# Patient Record
Sex: Female | Born: 1985
Health system: Southern US, Community
[De-identification: ages and names within clinical notes are randomized; demographics above are authoritative.]

## PROBLEM LIST (undated history)

## (undated) DIAGNOSIS — E119 Type 2 diabetes mellitus without complications: Secondary | ICD-10-CM

## (undated) DIAGNOSIS — F419 Anxiety disorder, unspecified: Secondary | ICD-10-CM

## (undated) DIAGNOSIS — O24419 Gestational diabetes mellitus in pregnancy, unspecified control: Secondary | ICD-10-CM

## (undated) HISTORY — PX: WISDOM TOOTH EXTRACTION: SHX21

## (undated) HISTORY — DX: Gestational diabetes mellitus in pregnancy, unspecified control: O24.419

---

## 2011-12-13 DIAGNOSIS — Z87440 Personal history of urinary (tract) infections: Secondary | ICD-10-CM | POA: Insufficient documentation

## 2011-12-13 DIAGNOSIS — Z87718 Personal history of other specified (corrected) congenital malformations of genitourinary system: Secondary | ICD-10-CM | POA: Insufficient documentation

## 2011-12-13 DIAGNOSIS — Z87448 Personal history of other diseases of urinary system: Secondary | ICD-10-CM | POA: Insufficient documentation

## 2011-12-13 DIAGNOSIS — Z9189 Other specified personal risk factors, not elsewhere classified: Secondary | ICD-10-CM | POA: Insufficient documentation

## 2013-05-20 DIAGNOSIS — E538 Deficiency of other specified B group vitamins: Secondary | ICD-10-CM | POA: Insufficient documentation

## 2013-09-17 DIAGNOSIS — H5210 Myopia, unspecified eye: Secondary | ICD-10-CM | POA: Insufficient documentation

## 2013-12-12 DIAGNOSIS — O4590 Premature separation of placenta, unspecified, unspecified trimester: Secondary | ICD-10-CM

## 2015-02-17 ENCOUNTER — Encounter: Payer: Self-pay | Admitting: Emergency Medicine

## 2015-02-17 ENCOUNTER — Emergency Department
Admission: EM | Admit: 2015-02-17 | Discharge: 2015-02-17 | Disposition: A | Discharge status: Home / Self Care | Payer: 59 | Source: Home / Self Care | Attending: Family Medicine | Admitting: Family Medicine

## 2015-02-17 DIAGNOSIS — N3090 Cystitis, unspecified without hematuria: Secondary | ICD-10-CM

## 2015-02-17 DIAGNOSIS — R300 Dysuria: Secondary | ICD-10-CM

## 2015-02-17 LAB — POCT URINALYSIS DIP (MANUAL ENTRY)
Bilirubin, UA: NEGATIVE
Glucose, UA: NEGATIVE
Ketones, POC UA: NEGATIVE
NITRITE UA: NEGATIVE
Protein Ur, POC: NEGATIVE
Spec Grav, UA: 1.01 (ref 1.005–1.03)
UROBILINOGEN UA: 0.2 (ref 0–1)
pH, UA: 6.5 (ref 5–8)

## 2015-02-17 MED ORDER — CEPHALEXIN 500 MG PO CAPS: ORAL_CAPSULE | ORAL | Status: DC

## 2015-02-17 NOTE — ED Notes (Signed)
Polyuria started this morning

## 2015-02-17 NOTE — ED Provider Notes (Signed)
CSN: [id number]     Arrival date & time 02/17/15  1343 History   First MD Initiated Contact with Patient 02/17/15 1411     Chief Complaint  Patient presents with  . Polyuria      HPI Comments: Patient awoke this morning with dysuria, frequency, urgency, and hesitancy.  No fevers, chills, and sweats.  No abdominal or pelvic pain.  Patient's last menstrual period was 02/04/2015.   Patient is a 29 y.o. female presenting with dysuria. The history is provided by the patient.  Dysuria Pain quality:  Burning Pain severity:  Moderate Onset quality:  Sudden Duration:  7 hours Timing:  Constant Progression:  Worsening Chronicity:  New Recent urinary tract infections: no   Relieved by:  Nothing Worsened by:  Nothing tried Ineffective treatments:  Phenazopyridine Urinary symptoms: foul-smelling urine, frequent urination and hesitancy   Urinary symptoms: no discolored urine, no hematuria and no bladder incontinence   Associated symptoms: no abdominal pain, no fever, no flank pain, no genital lesions, no nausea, no vaginal discharge and no vomiting   Risk factors: recurrent urinary tract infections     History reviewed. No pertinent past medical history. Past Surgical History  Procedure Laterality Date  . Cesarean section     Family History  Problem Relation Age of Onset  . Diabetes Mother   . Hypertension Father    History  Substance Use Topics  . Smoking status: Never Smoker   . Smokeless tobacco: Not on file  . Alcohol Use: Yes   OB History    No data available     Review of Systems  Constitutional: Negative for fever.  Gastrointestinal: Negative for nausea, vomiting and abdominal pain.  Genitourinary: Positive for dysuria. Negative for flank pain and vaginal discharge.  All other systems reviewed and are negative.   Allergies  Review of patient's allergies indicates no known allergies.  Home Medications   Prior to Admission medications   Medication Sig Start Date  End Date Taking? Authorizing Provider  cephALEXin (KEFLEX) 500 MG capsule Take one capsule by mouth every 12 hours for one week 02/17/15   Lattie HawStephen A Bilal Manzer, MD   BP 104/71 mmHg  Pulse 87  Temp(Src) 97.7 F (36.5 C) (Oral)  Ht 4\' 11"  (1.499 m)  Wt 112 lb (50.803 kg)  BMI 22.61 kg/m2  SpO2 100%  LMP 02/04/2015 Physical Exam Nursing notes and Vital Signs reviewed. Appearance:  Patient appears stated age, and in no acute distress Eyes:  Pupils are equal, round, and reactive to light and accomodation.  Extraocular movement is intact.  Conjunctivae are not inflamed   Pharynx:  Normal; moist mucous membranes  Neck:  Supple.  No adenopathy Lungs:  Clear to auscultation.  Breath sounds are equal.  Heart:  Regular rate and rhythm without murmurs, rubs, or gallops.  Abdomen:  Nontender without masses or hepatosplenomegaly.  Bowel sounds are present.  No CVA or flank tenderness.  Extremities:  No edema.  No calf tenderness Skin:  No rash present.    ED Course  Procedures  None    Labs Reviewed  URINE CULTURE  POCT URINALYSIS DIP (MANUAL ENTRY):  BLO small; LEU moderate; otherwise negative         MDM   1. Dysuria   2. Cystitis    Urine culture pending.  Begin Keflex 500mg  BID Continue increased fluid intake. Followup with Family Doctor if not improved in one week.     Lattie HawStephen A Essica Kiker, MD 02/17/15 [phone]

## 2015-02-17 NOTE — Discharge Instructions (Signed)
Continue increased fluid intake. ° ° °Urinary Tract Infection °Urinary tract infections (UTIs) can develop anywhere along your urinary tract. Your urinary tract is your body's drainage system for removing wastes and extra water. Your urinary tract includes two kidneys, two ureters, a bladder, and a urethra. Your kidneys are a pair of bean-shaped organs. Each kidney is about the size of your fist. They are located below your ribs, one on each side of your spine. °CAUSES °Infections are caused by microbes, which are microscopic organisms, including fungi, viruses, and bacteria. These organisms are so small that they can only be seen through a microscope. Bacteria are the microbes that most commonly cause UTIs. °SYMPTOMS  °Symptoms of UTIs may vary by age and gender of the patient and by the location of the infection. Symptoms in young women typically include a frequent and intense urge to urinate and a painful, burning feeling in the bladder or urethra during urination. Older women and men are more likely to be tired, shaky, and weak and have muscle aches and abdominal pain. A fever may mean the infection is in your kidneys. Other symptoms of a kidney infection include pain in your back or sides below the ribs, nausea, and vomiting. °DIAGNOSIS °To diagnose a UTI, your caregiver will ask you about your symptoms. Your caregiver also will ask to provide a urine sample. The urine sample will be tested for bacteria and white blood cells. White blood cells are made by your body to help fight infection. °TREATMENT  °Typically, UTIs can be treated with medication. Because most UTIs are caused by a bacterial infection, they usually can be treated with the use of antibiotics. The choice of antibiotic and length of treatment depend on your symptoms and the type of bacteria causing your infection. °HOME CARE INSTRUCTIONS °· If you were prescribed antibiotics, take them exactly as your caregiver instructs you. Finish the  medication even if you feel better after you have only taken some of the medication. °· Drink enough water and fluids to keep your urine clear or pale yellow. °· Avoid caffeine, tea, and carbonated beverages. They tend to irritate your bladder. °· Empty your bladder often. Avoid holding urine for long periods of time. °· Empty your bladder before and after sexual intercourse. °· After a bowel movement, women should cleanse from front to back. Use each tissue only once. °SEEK MEDICAL CARE IF:  °· You have back pain. °· You develop a fever. °· Your symptoms do not begin to resolve within 3 days. °SEEK IMMEDIATE MEDICAL CARE IF:  °· You have severe back pain or lower abdominal pain. °· You develop chills. °· You have nausea or vomiting. °· You have continued burning or discomfort with urination. °MAKE SURE YOU:  °· Understand these instructions. °· Will watch your condition. °· Will get help right away if you are not doing well or get worse. °Document Released: 09/20/2005 Document Revised: 06/11/2012 Document Reviewed: 01/19/2012 °ExitCare® Patient Information ©2015 [employer]. This information is not intended to replace advice given to you by your health care provider. Make sure you discuss any questions you have with your health care provider. ° °

## 2015-02-19 LAB — URINE CULTURE

## 2015-02-21 ENCOUNTER — Telehealth: Payer: Self-pay | Admitting: *Deleted

## 2015-03-21 LAB — OB RESULTS CONSOLE GC/CHLAMYDIA: GC PROBE AMP, GENITAL: NEGATIVE

## 2015-05-18 ENCOUNTER — Other Ambulatory Visit (HOSPITAL_BASED_OUTPATIENT_CLINIC_OR_DEPARTMENT_OTHER): Payer: Self-pay | Admitting: Internal Medicine

## 2015-05-18 DIAGNOSIS — M25531 Pain in right wrist: Secondary | ICD-10-CM

## 2015-05-19 ENCOUNTER — Ambulatory Visit (HOSPITAL_BASED_OUTPATIENT_CLINIC_OR_DEPARTMENT_OTHER)
Admission: RE | Admit: 2015-05-19 | Discharge: 2015-05-19 | Disposition: A | Discharge status: Home / Self Care | Payer: 59 | Source: Ambulatory Visit | Attending: Internal Medicine | Admitting: Internal Medicine

## 2015-05-19 DIAGNOSIS — M25531 Pain in right wrist: Secondary | ICD-10-CM | POA: Diagnosis not present

## 2015-05-19 DIAGNOSIS — R2231 Localized swelling, mass and lump, right upper limb: Secondary | ICD-10-CM | POA: Insufficient documentation

## 2015-07-17 ENCOUNTER — Emergency Department
Admission: EM | Admit: 2015-07-17 | Discharge: 2015-07-17 | Disposition: A | Discharge status: Home / Self Care | Payer: 59 | Source: Home / Self Care | Attending: Family Medicine | Admitting: Family Medicine

## 2015-07-17 ENCOUNTER — Telehealth: Payer: Self-pay | Admitting: Emergency Medicine

## 2015-07-17 ENCOUNTER — Encounter: Payer: Self-pay | Admitting: *Deleted

## 2015-07-17 DIAGNOSIS — N3090 Cystitis, unspecified without hematuria: Secondary | ICD-10-CM

## 2015-07-17 LAB — POCT URINALYSIS DIP (MANUAL ENTRY)
BILIRUBIN UA: NEGATIVE
BILIRUBIN UA: NEGATIVE
Glucose, UA: NEGATIVE
Nitrite, UA: NEGATIVE
Protein Ur, POC: NEGATIVE
Urobilinogen, UA: 0.2 (ref 0–1)
pH, UA: 6.5 (ref 5–8)

## 2015-07-17 MED ORDER — CEPHALEXIN 500 MG PO CAPS: ORAL_CAPSULE | ORAL | Status: DC

## 2015-07-17 NOTE — ED Provider Notes (Signed)
CSN: [id number]     Arrival date & time 07/17/15  4098 History   First MD Initiated Contact with Patient 07/17/15 1106     Chief Complaint  Patient presents with  . Polyuria      HPI Comments: Patient awoke today with urgency, urinary frequency, and lower abdominal pressure.  No pelvic or abdominal pain.  She may have had chills.  She is finishing her current menstrual period.  She states that she is currently breast feeding.  Patient is a 29 y.o. female presenting with frequency. The history is provided by the patient.  Urinary Frequency This is a new problem. The current episode started 3 to 5 hours ago. The problem occurs constantly. The problem has been gradually worsening. Pertinent negatives include no abdominal pain. Nothing aggravates the symptoms. Nothing relieves the symptoms. She has tried nothing for the symptoms.    History reviewed. No pertinent past medical history. Past Surgical History  Procedure Laterality Date  . Cesarean section     Family History  Problem Relation Age of Onset  . Diabetes Mother   . Hypertension Father    History  Substance Use Topics  . Smoking status: Never Smoker   . Smokeless tobacco: Never Used  . Alcohol Use: Yes   OB History    No data available     Review of Systems  Constitutional: Negative for fever and fatigue.  Gastrointestinal: Negative for abdominal pain.  Genitourinary: Positive for urgency and frequency. Negative for hematuria, flank pain, vaginal discharge, difficulty urinating, vaginal pain and pelvic pain.  All other systems reviewed and are negative.   Allergies  Review of patient's allergies indicates no known allergies.  Home Medications   Prior to Admission medications   Medication Sig Start Date End Date Taking? Authorizing Provider  Multiple Vitamin (MULTIVITAMIN) tablet Take 1 tablet by mouth daily.   Yes Historical Provider, MD  cephALEXin (KEFLEX) 500 MG capsule Take one capsule by mouth every 12 hours  for one week 07/17/15   Lattie Haw, MD   BP 114/78 mmHg  Pulse 87  Temp(Src) 98.2 F (36.8 C) (Oral)  Ht 4\' 11"  (1.499 m)  Wt 113 lb 6.4 oz (51.438 kg)  BMI 22.89 kg/m2  SpO2 99%  LMP 07/10/2015 Physical Exam Nursing notes and Vital Signs reviewed. Appearance:  Patient appears stated age, and in no acute distress Eyes:  Pupils are equal, round, and reactive to light and accomodation.  Extraocular movement is intact.  Conjunctivae are not inflamed  Pharynx:  Normal; moist mucous membranes  Neck:  Supple.  No adenopathy Lungs:  Clear to auscultation.  Breath sounds are equal.  Moving air well. Heart:  Regular rate and rhythm without murmurs, rubs, or gallops.  Abdomen:  Nontender without masses or hepatosplenomegaly.  Bowel sounds are present.  No CVA or flank tenderness.  Extremities:  No edema.  No calf tenderness Skin:  No rash present.   ED Course  Procedures none   Labs Reviewed  POCT URINALYSIS DIP (MANUAL ENTRY) - Abnormal; Notable for the following:    Color, UA light yellow (*)    Blood, UA small (*)    Leukocytes, UA moderate (2+) (*)    All other components within normal limits  URINE CULTURE      MDM   1. Cystitis    Urine culture pending. Begin Keflex 500mg  BID for one week. Continue increased fluid intake. If symptoms become significantly worse during the night or over the weekend, proceed  to the local emergency room.  Followup with Family Doctor if not improved in one week.     Lattie Haw, MD 07/18/15 2119

## 2015-07-17 NOTE — Discharge Instructions (Signed)
Continue increased fluid intake. °If symptoms become significantly worse during the night or over the weekend, proceed to the local emergency room.  ° ° °Urinary Tract Infection °Urinary tract infections (UTIs) can develop anywhere along your urinary tract. Your urinary tract is your body's drainage system for removing wastes and extra water. Your urinary tract includes two kidneys, two ureters, a bladder, and a urethra. Your kidneys are a pair of bean-shaped organs. Each kidney is about the size of your fist. They are located below your ribs, one on each side of your spine. °CAUSES °Infections are caused by microbes, which are microscopic organisms, including fungi, viruses, and bacteria. These organisms are so small that they can only be seen through a microscope. Bacteria are the microbes that most commonly cause UTIs. °SYMPTOMS  °Symptoms of UTIs may vary by age and gender of the patient and by the location of the infection. Symptoms in young women typically include a frequent and intense urge to urinate and a painful, burning feeling in the bladder or urethra during urination. Older women and men are more likely to be tired, shaky, and weak and have muscle aches and abdominal pain. A fever may mean the infection is in your kidneys. Other symptoms of a kidney infection include pain in your back or sides below the ribs, nausea, and vomiting. °DIAGNOSIS °To diagnose a UTI, your caregiver will ask you about your symptoms. Your caregiver also will ask to provide a urine sample. The urine sample will be tested for bacteria and white blood cells. White blood cells are made by your body to help fight infection. °TREATMENT  °Typically, UTIs can be treated with medication. Because most UTIs are caused by a bacterial infection, they usually can be treated with the use of antibiotics. The choice of antibiotic and length of treatment depend on your symptoms and the type of bacteria causing your infection. °HOME CARE  INSTRUCTIONS °· If you were prescribed antibiotics, take them exactly as your caregiver instructs you. Finish the medication even if you feel better after you have only taken some of the medication. °· Drink enough water and fluids to keep your urine clear or pale yellow. °· Avoid caffeine, tea, and carbonated beverages. They tend to irritate your bladder. °· Empty your bladder often. Avoid holding urine for long periods of time. °· Empty your bladder before and after sexual intercourse. °· After a bowel movement, women should cleanse from front to back. Use each tissue only once. °SEEK MEDICAL CARE IF:  °· You have back pain. °· You develop a fever. °· Your symptoms do not begin to resolve within 3 days. °SEEK IMMEDIATE MEDICAL CARE IF:  °· You have severe back pain or lower abdominal pain. °· You develop chills. °· You have nausea or vomiting. °· You have continued burning or discomfort with urination. °MAKE SURE YOU:  °· Understand these instructions. °· Will watch your condition. °· Will get help right away if you are not doing well or get worse. °Document Released: 09/20/2005 Document Revised: 06/11/2012 Document Reviewed: 01/19/2012 °ExitCare® Patient Information ©2015 [employer]. This information is not intended to replace advice given to you by your health care provider. Make sure you discuss any questions you have with your health care provider. ° °

## 2015-07-17 NOTE — ED Notes (Signed)
Pt c/o increased of frequency, urgency, no dysuria since this am.  No blood.  Pt has IUD.

## 2015-07-18 LAB — URINE CULTURE

## 2015-07-22 ENCOUNTER — Encounter: Payer: Self-pay | Admitting: Obstetrics & Gynecology

## 2015-07-22 ENCOUNTER — Ambulatory Visit (INDEPENDENT_AMBULATORY_CARE_PROVIDER_SITE_OTHER): Payer: 59 | Admitting: Obstetrics & Gynecology

## 2015-07-22 VITALS — BP 99/64 | HR 87 | Resp 16 | Ht 59.0 in | Wt 115.0 lb

## 2015-07-22 DIAGNOSIS — Z1151 Encounter for screening for human papillomavirus (HPV): Secondary | ICD-10-CM | POA: Diagnosis not present

## 2015-07-22 DIAGNOSIS — Z9889 Other specified postprocedural states: Secondary | ICD-10-CM

## 2015-07-22 DIAGNOSIS — Z98891 History of uterine scar from previous surgery: Secondary | ICD-10-CM

## 2015-07-22 DIAGNOSIS — Z01419 Encounter for gynecological examination (general) (routine) without abnormal findings: Secondary | ICD-10-CM

## 2015-07-22 DIAGNOSIS — Z30432 Encounter for removal of intrauterine contraceptive device: Secondary | ICD-10-CM | POA: Diagnosis not present

## 2015-07-22 DIAGNOSIS — Z124 Encounter for screening for malignant neoplasm of cervix: Secondary | ICD-10-CM

## 2015-07-22 DIAGNOSIS — R7989 Other specified abnormal findings of blood chemistry: Secondary | ICD-10-CM | POA: Insufficient documentation

## 2015-07-22 LAB — CBC
HCT: 40.3 % (ref 36.0–46.0)
Hemoglobin: 13.1 g/dL (ref 12.0–15.0)
MCH: 24.9 pg — ABNORMAL LOW (ref 26.0–34.0)
MCHC: 32.5 g/dL (ref 30.0–36.0)
MCV: 76.6 fL — ABNORMAL LOW (ref 78.0–100.0)
MPV: 9.6 fL (ref 8.6–12.4)
Platelets: 317 10*3/uL (ref 150–400)
RBC: 5.26 MIL/uL — ABNORMAL HIGH (ref 3.87–5.11)
RDW: 14.3 % (ref 11.5–15.5)
WBC: 10.6 10*3/uL — AB (ref 4.0–10.5)

## 2015-07-22 LAB — VITAMIN B12: Vitamin B-12: 549 pg/mL (ref 211–911)

## 2015-07-22 MED ORDER — CEPHALEXIN 500 MG PO CAPS: ORAL_CAPSULE | ORAL | Status: DC

## 2015-07-22 NOTE — Progress Notes (Addendum)
  Subjective:     Paige Gamble is a 29 y.o. female here for a routine exam.  Current complaints: desires pregnancy soon and wants IUD removed.  On prenatal vitamins.  Had "boil"on right aereola and mashed it.  Redness is gone and now fills like little knot.  Pt has recurrent uti esp after sex.   Gynecologic History Patient's last menstrual period was 07/10/2015. Contraception: IUD Last Pap: Nml per patient (in [place] at [place]).  Obstetric History OB History  Gravida Para Term Preterm AB SAB TAB Ectopic Multiple Living  # Outcome Date GA Lbr Len/2nd Weight Sex Delivery Anes PTL Lv  1 Term      CS-Classical          The following portions of the patient's history were reviewed and updated as appropriate: allergies, current medications, past family history, past medical history, past social history, past surgical history and problem list.  Review of Systems A comprehensive review of systems was negative.  (other than what is in the HPI)   Objective:      Filed Vitals:   07/22/15 0813  BP: 99/64  Pulse: 87  Resp: 16  Height:  (1.499 m)  Weight: 115 lb (52.164 kg)   Vitals:  WNL General appearance: alert, cooperative and no distress Head: Normocephalic, without obvious abnormality, atraumatic Eyes: negative Throat: lips, mucosa, and tongue normal; teeth and gums normal Lungs: clear to auscultation bilaterally Breasts: normal appearance, Right areola 2 o'clock show some induration from resolving boil.   No nipple discharge or bleeding Heart: regular rate and rhythm Abdomen: soft, non-tender; bowel sounds normal; no masses,  no organomegaly  Pelvic:  External Genitalia:  Tanner V, no lesion Urethra:  No prolpase Vagina:  Pink, normal rugae, no blood or discharge Cervix:  No CMT, no lesion Uterus:  Normal size and contour, non tender Adnexa:  Normal, no masses, non tender  Extremities: no edema, redness or tenderness in the calves or thighs Skin:  no lesions or rash Lymph nodes: Axillary adenopathy: none        Assessment:    Healthy female exam.   Desires fertility Recurrent UTI Resolvign boil on right breast    Plan:    Education reviewed: self breast exams and skin cancer screening. Contraception: none. Follow up in: 1 year. PNV  pap smear with co-testing Keflex 1 tab b/f  Sex to prevent UTI Pt to call if breast mass is still present in 3 weeks and will get [place].      GYNECOLOGY CLINIC PROCEDURE NOTE  Paige Gamble is a 29 y.o. G1P1001 here for  IUD removal  IUD Removal  Patient identified, informed consent performed. Discussed risks of irregular bleeding, cramping, infection, malpositioning or misplacement of the IUD outside the uterus which may require further procedures. Time out was performed. Speculum placed in the vagina. Cervix visualized. Cleaned with Betadine x 2. Grasped anteriorly with a single tooth tenaculum. The strings of the IUD were grasped and pulled using ring forceps. The IUD was successfully removed in its entirety.  Minimal bleeding.  Patient tolerated procedure well.

## 2015-07-23 ENCOUNTER — Telehealth: Payer: Self-pay | Admitting: *Deleted

## 2015-07-23 LAB — CYTOLOGY - PAP

## 2015-07-23 NOTE — Telephone Encounter (Signed)
-----   Message from Lesly Dukes, MD sent at 07/23/2015  8:08 AM EDT ----- Vit B12 level is normal.  RN to call.

## 2015-07-23 NOTE — Telephone Encounter (Signed)
LM on voicemail with normal labs.

## 2015-07-27 ENCOUNTER — Telehealth: Payer: Self-pay | Admitting: *Deleted

## 2015-09-03 ENCOUNTER — Ambulatory Visit (INDEPENDENT_AMBULATORY_CARE_PROVIDER_SITE_OTHER): Payer: 59 | Admitting: Family

## 2015-09-03 ENCOUNTER — Encounter: Payer: Self-pay | Admitting: Family

## 2015-09-03 VITALS — BP 105/67 | HR 83 | Wt 113.0 lb

## 2015-09-03 DIAGNOSIS — O3421 Maternal care for scar from previous cesarean delivery: Secondary | ICD-10-CM

## 2015-09-03 DIAGNOSIS — Z3481 Encounter for supervision of other normal pregnancy, first trimester: Secondary | ICD-10-CM | POA: Diagnosis not present

## 2015-09-03 DIAGNOSIS — Z3491 Encounter for supervision of normal pregnancy, unspecified, first trimester: Secondary | ICD-10-CM

## 2015-09-03 DIAGNOSIS — O34219 Maternal care for unspecified type scar from previous cesarean delivery: Secondary | ICD-10-CM | POA: Insufficient documentation

## 2015-09-03 DIAGNOSIS — Z98891 History of uterine scar from previous surgery: Secondary | ICD-10-CM | POA: Insufficient documentation

## 2015-09-03 MED ORDER — DOXYLAMINE-PYRIDOXINE 10-10 MG PO TBEC: DELAYED_RELEASE_TABLET | ORAL | Status: DC

## 2015-09-03 NOTE — Progress Notes (Signed)
NOB intake here today with spouse.  Bedside U/S shows IUP with FHT of 162 BPM and CRL 13.89mm GA is [redacted]w[redacted]d by measurements.  Pt is signing onto [employer]. BABYSCRIPTS PATIENT: [x ] initial,  12,  20,  28,  32,  36,  38,  39,  40

## 2015-09-03 NOTE — Addendum Note (Signed)
Addended by: Marlis Edelson on: 09/03/2015 12:35 PM   Modules accepted: Orders

## 2015-09-03 NOTE — Progress Notes (Signed)
   Subjective:    Paige Gamble is a G2P1002 [redacted]w[redacted]d being seen today for her first obstetrical visit.  Her obstetrical history is significant for prior csection. Patient does intend to breast feed. Pregnancy history fully reviewed.  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  Reports increased nausea with no vomiting.    Patient reports nausea, no bleeding and no cramping.  Filed Vitals:   09/03/15 0958  BP: 105/67  Pulse: 83  Weight: 113 lb (51.256 kg)    HISTORY: OB History  Gravida Para Term Preterm AB SAB TAB Ectopic Multiple Living  # Outcome Date GA Lbr Len/2nd Weight Sex Delivery Anes PTL Lv  2 Current           1 Term 12/12/13 [redacted]w[redacted]d  6 lb 8 oz (2.948 kg) F CS-LTranv  N Y     Complications: Chorioamnionitis,Placental abruption     No past medical history on file. Past Surgical History  Procedure Laterality Date  . Cesarean section    . Wisdom tooth extraction     Family History  Problem Relation Age of Onset  . Diabetes Mother   . Hypertension Father      Exam    BP 105/67 mmHg  Pulse 83  Wt 113 lb (51.256 kg)  LMP 07/10/2015 (Exact Date) Uterine Size: size equals dates  Pelvic Exam:    Perineum: No Hemorrhoids, Normal Perineum   Vulva: normal   Vagina:  normal mucosa, normal discharge, no palpable nodules   pH: Not done   Bony Pelvis: Adequate  System: Breast:  No nipple retraction or dimpling, No nipple discharge or bleeding, No axillary or supraclavicular adenopathy, Normal to palpation without dominant masses   Skin: normal coloration and turgor, no rashes    Neurologic: negative   Extremities: normal strength, tone, and muscle mass   HEENT neck supple with midline trachea and thyroid without masses   Mouth/Teeth mucous membranes moist, pharynx normal without lesions   Neck supple and no masses   Cardiovascular: regular rate and rhythm, no murmurs or gallops   Respiratory:  appears well, vitals normal, no respiratory distress,  acyanotic, normal RR, neck free of mass or lymphadenopathy, chest clear, no wheezing, crepitations, rhonchi, normal symmetric air entry   Abdomen: soft, non-tender; bowel sounds normal; no masses,  no organomegaly   Urinary: urethral meatus normal        Assessment:    Pregnancy: 29 y.o. G2P1002  [redacted]w[redacted]d wks IUP Patient Active Problem List   Diagnosis Date Noted  . Supervision of normal pregnancy 09/03/2015  . Previous cesarean delivery affecting pregnancy, antepartum 09/03/2015  . History of cesarean section 07/22/2015  . Error, refractive, myopia 09/17/2013  . 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 H/O anxiety state 12/13/2011        Plan:     Initial labs drawn. Prenatal vitamins. RX Diclegis Problem list reviewed and updated. Genetic Screening discussed First Screen: order at next visit.  Follow up in 4 weeks.  Marlis Edelson 09/03/2015

## 2015-09-03 NOTE — Patient Instructions (Signed)

## 2015-09-04 LAB — SICKLE CELL SCREEN: Sickle Cell Screen: NEGATIVE

## 2015-09-04 LAB — GC/CHLAMYDIA PROBE AMP
CT PROBE, AMP APTIMA: NEGATIVE
GC Probe RNA: NEGATIVE

## 2015-09-04 LAB — HIV ANTIBODY (ROUTINE TESTING W REFLEX): HIV: NONREACTIVE

## 2015-09-05 LAB — CULTURE, URINE COMPREHENSIVE
COLONY COUNT: NO GROWTH
Organism ID, Bacteria: NO GROWTH

## 2015-09-06 LAB — OBSTETRIC PANEL
ANTIBODY SCREEN: NEGATIVE
BASOS ABS: 0 10*3/uL (ref 0.0–0.1)
Basophils Relative: 0 % (ref 0–1)
Eosinophils Absolute: 0.3 10*3/uL (ref 0.0–0.7)
Eosinophils Relative: 3 % (ref 0–5)
HCT: 40 % (ref 36.0–46.0)
HEP B S AG: NEGATIVE
Hemoglobin: 13.1 g/dL (ref 12.0–15.0)
LYMPHS ABS: 2.5 10*3/uL (ref 0.7–4.0)
LYMPHS PCT: 24 % (ref 12–46)
MCH: 25.1 pg — AB (ref 26.0–34.0)
MCHC: 32.8 g/dL (ref 30.0–36.0)
MCV: 76.6 fL — AB (ref 78.0–100.0)
MPV: 9.1 fL (ref 8.6–12.4)
Monocytes Absolute: 0.4 10*3/uL (ref 0.1–1.0)
Monocytes Relative: 4 % (ref 3–12)
NEUTROS ABS: 7.2 10*3/uL (ref 1.7–7.7)
Neutrophils Relative %: 69 % (ref 43–77)
PLATELETS: 301 10*3/uL (ref 150–400)
RBC: 5.22 MIL/uL — ABNORMAL HIGH (ref 3.87–5.11)
RDW: 14.6 % (ref 11.5–15.5)
RUBELLA: 2.76 {index} — AB (ref <0.90)
Rh Type: POSITIVE
WBC: 10.4 10*3/uL (ref 4.0–10.5)

## 2015-09-12 ENCOUNTER — Encounter: Payer: Self-pay | Admitting: Emergency Medicine

## 2015-09-12 ENCOUNTER — Emergency Department
Admission: EM | Admit: 2015-09-12 | Discharge: 2015-09-12 | Disposition: A | Discharge status: Home / Self Care | Payer: 59 | Source: Home / Self Care | Attending: Family Medicine | Admitting: Family Medicine

## 2015-09-12 DIAGNOSIS — L03115 Cellulitis of right lower limb: Secondary | ICD-10-CM | POA: Diagnosis not present

## 2015-09-12 MED ORDER — CLINDAMYCIN HCL 150 MG PO CAPS: 300.0000 mg | ORAL_CAPSULE | Freq: Three times a day (TID) | ORAL | Status: DC

## 2015-09-12 NOTE — ED Provider Notes (Signed)
CSN: [id number]     Arrival date & time 09/12/15  1124 History   First MD Initiated Contact with Patient 09/12/15 1207     Chief Complaint  Patient presents with  . Cellulitis   (Consider location/radiation/quality/duration/timing/severity/associated sxs/prior Treatment) HPI Pt is a 29yo female presenting to [facility] with c/o gradually worsening area of redness, tenderness and heat to Right posterior thigh that started 4 days ago. Pt states she thought it started off as a pimple.  States her boyfriend tried to pop it but minimal discharge came out. Redness has continued to worsen. Pain is aching and sore, mild to moderate in severity, worse with palpation or in certain positions as certain seated positions apply pressure to area. Denies hx of similar rashes or abscess. Denies known insect bites. No knew soaps, lotions or medications. Rash does not itch. Denies fever, chills, n/v/d.  History reviewed. No pertinent past medical history. Past Surgical History  Procedure Laterality Date  . Cesarean section    . Wisdom tooth extraction     Family History  Problem Relation Age of Onset  . Diabetes Mother   . Hypertension Father    Social History  Substance Use Topics  . Smoking status: Never Smoker   . Smokeless tobacco: Never Used  . Alcohol Use: 0.0 oz/week    0 Standard drinks or equivalent per week   OB History    Gravida Para Term Preterm AB TAB SAB Ectopic Multiple Living   Review of Systems  Constitutional: Negative for fever and chills.  Gastrointestinal: Negative for nausea, vomiting, abdominal pain and diarrhea.  Skin: Positive for color change, rash and wound ( "pimple").       Right posterior thigh  Neurological: Negative for weakness and numbness.    Allergies  Review of patient's allergies indicates no known allergies.  Home Medications   Prior to Admission medications   Medication Sig Start Date End Date Taking? Authorizing Provider  clindamycin  (CLEOCIN) 150 MG capsule Take 2 capsules (300 mg total) by mouth 3 (three) times daily. For 7 days 09/12/15   Junius Finner, PA-C  Doxylamine-Pyridoxine 10-10 MG TBEC Take two at bedtime, if no improvement add another one in the morning.  If still no improvement then add another one in the afternoon. 09/03/15   Marlis Edelson, CNM  Prenatal Vit-Fe Fumarate-FA (MULTIVITAMIN-PRENATAL) 27-0.8 MG TABS tablet Take 1 tablet by mouth daily at 12 noon.    Historical Provider, MD   Meds Ordered and Administered this Visit  Medications - No data to display  BP 102/62 mmHg  Pulse 105  Temp(Src) 99.1 F (37.3 C) (Oral)  Resp 16  Ht  (1.499 m)  Wt 113 lb 8 oz (51.483 kg)  BMI 22.91 kg/m2  SpO2 100%  LMP 07/10/2015 (Exact Date) No data found.   Physical Exam  Constitutional: She is oriented to person, place, and time. She appears well-developed and well-nourished.  HENT:  Head: Normocephalic and atraumatic.  Eyes: EOM are normal.  Neck: Normal range of motion.  Cardiovascular: Normal rate.   Pulmonary/Chest: Effort normal.  Musculoskeletal: Normal range of motion. She exhibits tenderness. She exhibits no edema.  Right posterior thigh: mild tenderness (see skin exam) compartments are soft. FROM Right hip and knee  Neurological: She is alert and oriented to person, place, and time.  Skin: Skin is warm and dry. Rash noted. There is erythema.  3cm circular  area of erythema and 1cm centralized area of induration with pinpoint opening of skin. No active bleeding or discharge. No fluctuance. No red streaking. Mild tenderness.  Psychiatric: She has a normal mood and affect. Her behavior is normal.  Nursing note and vitals reviewed.   ED Course  Procedures (including critical care time)  Labs Review Labs Reviewed - No data to display  Imaging Review No results found.    MDM   1. Cellulitis of right thigh     Pt presenting to [facility] with cellulitis of Right posterior thigh. No abscess  that would benefit from I&D at this time. Will tx with Clindamycin  TID for 7 days, encouraged warm compresses 3-4 times daily. May take acetaminophen and ibuprofen for pain. F/u in 3-4 days for recheck of symptoms if not improving, sooner if worsening. Patient verbalized understanding and agreement with treatment plan.     Junius Finner, PA-C 09/12/15 1230

## 2015-09-12 NOTE — Discharge Instructions (Signed)
You make take 400-600mg  acetaminophen (Tylenol) every 4-6 hours and ibuprofen (Motrin) every 6-8 hours as needed for pain.  Please take antibiotics as prescribed and be sure to complete entire course even if you start to feel better to ensure infection does not come back.

## 2015-09-12 NOTE — ED Notes (Signed)
Patient presents to [facility] with C/O pain and redness and heat to the upper right posterior leg since Thursday, patient states "it started off as what appeared to be a pimple."

## 2015-09-15 ENCOUNTER — Telehealth: Payer: Self-pay | Admitting: *Deleted

## 2015-10-01 ENCOUNTER — Ambulatory Visit (INDEPENDENT_AMBULATORY_CARE_PROVIDER_SITE_OTHER): Payer: 59 | Admitting: Advanced Practice Midwife

## 2015-10-01 VITALS — BP 105/58 | HR 92 | Wt 116.0 lb

## 2015-10-01 DIAGNOSIS — Z3481 Encounter for supervision of other normal pregnancy, first trimester: Secondary | ICD-10-CM

## 2015-10-01 DIAGNOSIS — Z3491 Encounter for supervision of normal pregnancy, unspecified, first trimester: Secondary | ICD-10-CM

## 2015-10-01 MED ORDER — TERCONAZOLE 0.4 % VA CREA: 1.0000 | TOPICAL_CREAM | Freq: Every day | VAGINAL | Status: DC

## 2015-10-01 NOTE — Progress Notes (Signed)
BABYSCRIPTS PATIENT: [x ] initial, [ x] 12, [ ] 20, [ ] 28, [ ] 32, [ ] 36, [ ] 38, [ ] 39, [ ] 40 

## 2015-10-01 NOTE — Progress Notes (Signed)
Subjective:  Paige Gamble is a 29 y.o. G2P1002 at [redacted]w[redacted]d being seen today for ongoing prenatal care.  Patient reports no complaints.   .  Vag. Bleeding: None.  . Denies leaking of fluid.   The following portions of the patient's history were reviewed and updated as appropriate: allergies, current medications, past family history, past medical history, past social history, past surgical history and problem list.   Objective:   Filed Vitals:   10/01/15 0813  BP: 105/58  Pulse: 92  Weight: 52.617 kg (116 lb)    Fetal Status: Fetal Heart Rate (bpm): 151         General:  Alert, oriented and cooperative. Patient is in no acute distress.  Skin: Skin is warm and dry. No rash noted.   Cardiovascular: Normal heart rate noted  Respiratory: Normal respiratory effort, no problems with respiration noted  Abdomen: Soft, gravid, appropriate for gestational age. Pain/Pressure: Absent     Pelvic: Vag. Bleeding: None Vag D/C Character: Thin   Cervical exam deferred        Extremities: Normal range of motion.  Edema: None  Mental Status: Normal mood and affect. Normal behavior. Normal judgment and thought content.   Urinalysis: Urine Protein: Negative Urine Glucose: Negative  Assessment and Plan:  Pregnancy: G2P1002 at [redacted]w[redacted]d  1. Supervision of normal pregnancy, first trimester     Does want first trimester screening - AMB referral to maternal fetal medicine - [place] MFM Fetal Nuchal Translucency; Future  Preterm labor symptoms and general obstetric precautions including but not limited to vaginal bleeding, contractions, leaking of fluid and fetal movement were reviewed in detail with the patient. Please refer to After Visit Summary for other counseling recommendations.  Return in about 8 weeks (around 11/26/2015) for [employer].   Aviva Signs, CNM

## 2015-10-01 NOTE — Patient Instructions (Signed)
First Trimester of Pregnancy The first trimester of pregnancy is from week 1 until the end of week 12 (months 1 through 3). A week after a sperm fertilizes an egg, the egg will implant on the wall of the uterus. This embryo will begin to develop into a baby. Genes from you and your partner are forming the baby. The female genes determine whether the baby is a boy or a girl. At 6-8 weeks, the eyes and face are formed, and the heartbeat can be seen on ultrasound. At the end of 12 weeks, all the baby's organs are formed.  Now that you are pregnant, you will want to do everything you can to have a healthy baby. Two of the most important things are to get good prenatal care and to follow your health care provider's instructions. Prenatal care is all the medical care you receive before the baby's birth. This care will help prevent, find, and treat any problems during the pregnancy and childbirth. BODY CHANGES Your body goes through many changes during pregnancy. The changes vary from woman to woman.   You may gain or lose a couple of pounds at first.  You may feel sick to your stomach (nauseous) and throw up (vomit). If the vomiting is uncontrollable, call your health care provider.  You may tire easily.  You may develop headaches that can be relieved by medicines approved by your health care provider.  You may urinate more often. Painful urination may mean you have a bladder infection.  You may develop heartburn as a result of your pregnancy.  You may develop constipation because certain hormones are causing the muscles that push waste through your intestines to slow down.  You may develop hemorrhoids or swollen, bulging veins (varicose veins).  Your breasts may begin to grow larger and become tender. Your nipples may stick out more, and the tissue that surrounds them (areola) may become darker.  Your gums may bleed and may be sensitive to brushing and flossing.  Dark spots or blotches (chloasma,  mask of pregnancy) may develop on your face. This will likely fade after the baby is born.  Your menstrual periods will stop.  You may have a loss of appetite.  You may develop cravings for certain kinds of food.  You may have changes in your emotions from day to day, such as being excited to be pregnant or being concerned that something may go wrong with the pregnancy and baby.  You may have more vivid and strange dreams.  You may have changes in your hair. These can include thickening of your hair, rapid growth, and changes in texture. Some women also have hair loss during or after pregnancy, or hair that feels dry or thin. Your hair will most likely return to normal after your baby is born. WHAT TO EXPECT AT YOUR PRENATAL VISITS During a routine prenatal visit:  You will be weighed to make sure you and the baby are growing normally.  Your blood pressure will be taken.  Your abdomen will be measured to track your baby's growth.  The fetal heartbeat will be listened to starting around week 10 or 12 of your pregnancy.  Test results from any previous visits will be discussed. Your health care provider may ask you:  How you are feeling.  If you are feeling the baby move.  If you have had any abnormal symptoms, such as leaking fluid, bleeding, severe headaches, or abdominal cramping.  If you are using any tobacco products,   including cigarettes, chewing tobacco, and electronic cigarettes.  If you have any questions. Other tests that may be performed during your first trimester include:  Blood tests to find your blood type and to check for the presence of any previous infections. They will also be used to check for low iron levels (anemia) and Rh antibodies. Later in the pregnancy, blood tests for diabetes will be done along with other tests if problems develop.  Urine tests to check for infections, diabetes, or protein in the urine.  An ultrasound to confirm the proper growth  and development of the baby.  An amniocentesis to check for possible genetic problems.  Fetal screens for spina bifida and Down syndrome.  You may need other tests to make sure you and the baby are doing well.  HIV (human immunodeficiency virus) testing. Routine prenatal testing includes screening for HIV, unless you choose not to have this test. HOME CARE INSTRUCTIONS  Medicines  Follow your health care provider's instructions regarding medicine use. Specific medicines may be either safe or unsafe to take during pregnancy.  Take your prenatal vitamins as directed.  If you develop constipation, try taking a stool softener if your health care provider approves. Diet  Eat regular, well-balanced meals. Choose a variety of foods, such as meat or vegetable-based protein, fish, milk and low-fat dairy products, vegetables, fruits, and whole grain breads and cereals. Your health care provider will help you determine the amount of weight gain that is right for you.  Avoid raw meat and uncooked cheese. These carry germs that can cause birth defects in the baby.  Eating four or five small meals rather than three large meals a day may help relieve nausea and vomiting. If you start to feel nauseous, eating a few soda crackers can be helpful. Drinking liquids between meals instead of during meals also seems to help nausea and vomiting.  If you develop constipation, eat more high-fiber foods, such as fresh vegetables or fruit and whole grains. Drink enough fluids to keep your urine clear or pale yellow. Activity and Exercise  Exercise only as directed by your health care provider. Exercising will help you:  Control your weight.  Stay in shape.  Be prepared for labor and delivery.  Experiencing pain or cramping in the lower abdomen or low back is a good sign that you should stop exercising. Check with your health care provider before continuing normal exercises.  Try to avoid standing for long  periods of time. Move your legs often if you must stand in one place for a long time.  Avoid heavy lifting.  Wear low-heeled shoes, and practice good posture.  You may continue to have sex unless your health care provider directs you otherwise. Relief of Pain or Discomfort  Wear a good support bra for breast tenderness.   Take warm sitz baths to soothe any pain or discomfort caused by hemorrhoids. Use hemorrhoid cream if your health care provider approves.   Rest with your legs elevated if you have leg cramps or low back pain.  If you develop varicose veins in your legs, wear support hose. Elevate your feet for 15 minutes, 3-4 times a day. Limit salt in your diet. Prenatal Care  Schedule your prenatal visits by the twelfth week of pregnancy. They are usually scheduled monthly at first, then more often in the last 2 months before delivery.  Write down your questions. Take them to your prenatal visits.  Keep all your prenatal visits as directed by your   health care provider. Safety  Wear your seat belt at all times when driving.  Make a list of emergency phone numbers, including numbers for family, friends, the hospital, and police and fire departments. General Tips  Ask your health care provider for a referral to a local prenatal education class. Begin classes no later than at the beginning of month 6 of your pregnancy.  Ask for help if you have counseling or nutritional needs during pregnancy. Your health care provider can offer advice or refer you to specialists for help with various needs.  Do not use hot tubs, steam rooms, or saunas.  Do not douche or use tampons or scented sanitary pads.  Do not cross your legs for long periods of time.  Avoid cat litter boxes and soil used by cats. These carry germs that can cause birth defects in the baby and possibly loss of the fetus by miscarriage or stillbirth.  Avoid all smoking, herbs, alcohol, and medicines not prescribed by  your health care provider. Chemicals in these affect the formation and growth of the baby.  Do not use any tobacco products, including cigarettes, chewing tobacco, and electronic cigarettes. If you need help quitting, ask your health care provider. You may receive counseling support and other resources to help you quit.  Schedule a dentist appointment. At home, brush your teeth with a soft toothbrush and be gentle when you floss. SEEK MEDICAL CARE IF:   You have dizziness.  You have mild pelvic cramps, pelvic pressure, or nagging pain in the abdominal area.  You have persistent nausea, vomiting, or diarrhea.  You have a bad smelling vaginal discharge.  You have pain with urination.  You notice increased swelling in your face, hands, legs, or ankles. SEEK IMMEDIATE MEDICAL CARE IF:   You have a fever.  You are leaking fluid from your vagina.  You have spotting or bleeding from your vagina.  You have severe abdominal cramping or pain.  You have rapid weight gain or loss.  You vomit blood or material that looks like coffee grounds.  You are exposed to German measles and have never had them.  You are exposed to fifth disease or chickenpox.  You develop a severe headache.  You have shortness of breath.  You have any kind of trauma, such as from a fall or a car accident.   This information is not intended to replace advice given to you by your health care provider. Make sure you discuss any questions you have with your health care provider.   Document Released: 12/05/2001 Document Revised: 01/01/2015 Document Reviewed: 10/21/2013 Elsevier Interactive Patient Education 2016 [employer]  

## 2015-10-06 ENCOUNTER — Ambulatory Visit (HOSPITAL_COMMUNITY)
Admission: RE | Admit: 2015-10-06 | Discharge: 2015-10-06 | Disposition: A | Discharge status: Home / Self Care | Payer: 59 | Source: Ambulatory Visit | Attending: Advanced Practice Midwife | Admitting: Advanced Practice Midwife

## 2015-10-06 ENCOUNTER — Ambulatory Visit (HOSPITAL_COMMUNITY): Admission: RE | Admit: 2015-10-06 | Payer: 59 | Source: Ambulatory Visit

## 2015-10-06 ENCOUNTER — Encounter (HOSPITAL_COMMUNITY): Payer: Self-pay

## 2015-10-06 ENCOUNTER — Other Ambulatory Visit: Payer: Self-pay | Admitting: Advanced Practice Midwife

## 2015-10-06 VITALS — BP 115/74 | HR 93 | Wt 114.4 lb

## 2015-10-06 DIAGNOSIS — Z3A12 12 weeks gestation of pregnancy: Secondary | ICD-10-CM

## 2015-10-06 DIAGNOSIS — Z3491 Encounter for supervision of normal pregnancy, unspecified, first trimester: Secondary | ICD-10-CM

## 2015-10-06 DIAGNOSIS — O34219 Maternal care for unspecified type scar from previous cesarean delivery: Secondary | ICD-10-CM

## 2015-10-06 DIAGNOSIS — O289 Unspecified abnormal findings on antenatal screening of mother: Secondary | ICD-10-CM

## 2015-10-06 DIAGNOSIS — Z315 Encounter for genetic counseling: Secondary | ICD-10-CM | POA: Insufficient documentation

## 2015-10-06 NOTE — Progress Notes (Signed)
Genetic Counseling  Visit Summary Note  Appointment Date: 10/06/2015 Referred By: Seabron Spates, CNM  Date of Birth: [birth year]  Pregnancy history: G2P1001 Estimated Date of Delivery: 04/15/16 Estimated Gestational Age: [redacted]w[redacted]d  I met with Mrs. Ninfa Meeker  for genetic counseling because of the ultrasound finding of an increased fetal nuchal translucency.  Mrs. Lender initially requested to defer the discussion until someone could be with her since she came to this visit by herself.  An appointment was arranged for her for two days from now in our [place] office (at her request as this is closer to her home).  Then she stated that no one could be with her that day either and requested to meet with a genetic counselor today.  In summary:  Discussed ultrasound finding of increased NT  Discussed conditions associated with increased NT  Normal baby  Approximate 5% risk for aneuploidy  Declined screening or diagnostic testing today  Approximate 3-4% risk for cardiac abnormality  Needs to be scheduled for fetal echo  Increased risk for underlying genetic condition  Needs to be scheduled for targeted fetal anatomy at 18 weeks  Will notify this office or her referring physician if she wishes to proceed with screening or testing, was given information and contact phone numbers  By ultrasound today, the fetal nuchal translucency (NT) measured 3.7 mm.  We discussed that the fetal NT refers to a fluid filled space between the skin and soft tissues behind the cervical spine.  This space is traditionally measurable between 11 and 13.[redacted] weeks gestation and is considered enlarged at approximately 3 mm or greater, or 95th%tile.  Mrs. Gotschall was counseled regarding the various common etiologies for an enlarged NT including: aneuploidy, single gene conditions, cardiac or great vessel abnormalities, lymphatic system failure, decreased fetal movement, and fetal anemia.    We reviewed chromosomes,  nondisjunction, and the common features and prognoses of Down syndrome and other aneuploidies.  Considering Ms. Talford's age, an NT of 3.7 and a CRL of 68 mm, the risk for fetal aneuploidy is estimated to be ~1 in 20 (5%).   In addition, we discussed that an NT of 3.7 is associated with an approximate 3% risk for a fetal cardiac anomaly.  We also discussed single gene conditions and that these conditions are not routinely tested for prenatally unless ultrasound findings or family history significantly increase the suspicion of a specific single gene disorder.    We reviewed available screening options including First Screen, noninvasive prenatal screening (NIPS)/cell free fetal DNA (cffDNA) testing, and detailed ultrasound.  She was counseled that screening tests are used to modify a patient's a priori risk for aneuploidy. This estimate provides a pregnancy specific risk assessment.  Given that the fetal NT is increased, and that measurement is used in the calculation for the First screen, the First screen result would be biased and likely to come back abnormal.  We reviewed the benefits and limitations of each option. Specifically, we discussed the conditions for which each test screens, the detection rates, and false positive rates of each.  She was also counseled regarding diagnostic testing via [facility] and amniocentesis. We reviewed the approximate 1 in 016-010 risk for complications from amniocentesis, including spontaneous pregnancy loss. After consideration of all the options, she declined additional testing or screening for now.  She stated that she wished to discuss this information with her husband and would let us know if she wished to pursue testing or screening.  We also discussed the  importance of a detailed ultrasound at 18 weeks and fetal echocardiogram to rule out other explanations for the increased NT.  They were counseled that the fetal prognosis depends upon the underlying etiology of the enlarged  NT and further anticipatory guidance can be provided if a diagnosis is discovered.   Both family histories were reviewed and found to be noncontributory for birth defects, intellectual disability, and known genetic conditions. Without further information regarding the provided family history, an accurate genetic risk cannot be calculated. Further genetic counseling is warranted if more information is obtained.  Mrs. Disbro denied exposure to environmental toxins or chemical agents. She denied the use of alcohol, tobacco or street drugs. She denied significant viral illnesses during the course of her pregnancy. Her medical and surgical histories were noncontributory.   I counseled Mrs. Rout regarding the above risks and available options.  The approximate face-to-face time with the genetic counselor was 45 minutes.  Cam Hai, MS Certified Genetic Counselor

## 2015-11-26 ENCOUNTER — Ambulatory Visit (INDEPENDENT_AMBULATORY_CARE_PROVIDER_SITE_OTHER): Payer: 59 | Admitting: Family

## 2015-11-26 VITALS — BP 114/65 | HR 94 | Wt 124.0 lb

## 2015-11-26 DIAGNOSIS — Z3482 Encounter for supervision of other normal pregnancy, second trimester: Secondary | ICD-10-CM

## 2015-11-26 DIAGNOSIS — Z3491 Encounter for supervision of normal pregnancy, unspecified, first trimester: Secondary | ICD-10-CM

## 2015-11-26 NOTE — Progress Notes (Signed)
.   Subjective:  Paige Gamble is a 29 y.o. G2P1001 at 3190w6d being seen today for ongoing prenatal care.  She is currently monitored for the following issues for this low-risk pregnancy and has History of  myopia; Cyanocobalamine deficiency (non anemic); History of cesarean section affecting pregnancy on her problem list.  Pt reports have NIPS genetic testing completed and was called a week later and informed one week later it was negative.    Patient reports no complaints.  Enjoying [facility] program.  Contractions: Not present. Vag. Bleeding: None.  Movement: Present. Denies leaking of fluid.   The following portions of the patient's history were reviewed and updated as appropriate: allergies, current medications, past family history, past medical history, past social history, past surgical history and problem list. Problem list updated.  Objective:   Filed Vitals:   11/26/15 0827  BP: 114/65  Pulse: 94  Weight: 124 lb (56.246 kg)    Fetal Status:     Movement: Present     General:  Alert, oriented and cooperative. Patient is in no acute distress.  Skin: Skin is warm and dry. No rash noted.   Cardiovascular: Normal heart rate noted  Respiratory: Normal respiratory effort, no problems with respiration noted  Abdomen: Soft, gravid, appropriate for gestational age. Pain/Pressure: Absent     Pelvic: Vag. Bleeding: None Vag D/C Character: Thin   Cervical exam deferred        Extremities: Normal range of motion.  Edema: None  Mental Status: Normal mood and affect. Normal behavior. Normal judgment and thought content.   Urinalysis: Urine Protein: Negative Urine Glucose: Negative  Assessment and Plan:  Pregnancy: G2P1001 at 2090w6d  1. Supervision of normal pregnancy, first trimester - US MFM OB COMP + 14 WK; Future - Obtain NIPS results   General obstetric precautions including but not limited to vaginal bleeding and pelvic pain reviewed in detail with the patient. Please refer to  After Visit Summary for other counseling recommendations.   [facility] - follow-up at 28 wks  Marlis EdelsonWalidah N Karim, CNM

## 2015-11-30 ENCOUNTER — Ambulatory Visit (HOSPITAL_COMMUNITY)
Admission: RE | Admit: 2015-11-30 | Discharge: 2015-11-30 | Disposition: A | Discharge status: Home / Self Care | Payer: 59 | Source: Ambulatory Visit | Attending: Family | Admitting: Family

## 2015-11-30 ENCOUNTER — Other Ambulatory Visit: Payer: Self-pay | Admitting: Family

## 2015-11-30 DIAGNOSIS — Z36 Encounter for antenatal screening of mother: Secondary | ICD-10-CM | POA: Insufficient documentation

## 2015-11-30 DIAGNOSIS — O283 Abnormal ultrasonic finding on antenatal screening of mother: Secondary | ICD-10-CM | POA: Diagnosis not present

## 2015-11-30 DIAGNOSIS — Z3491 Encounter for supervision of normal pregnancy, unspecified, first trimester: Secondary | ICD-10-CM

## 2015-11-30 DIAGNOSIS — Z3A20 20 weeks gestation of pregnancy: Secondary | ICD-10-CM | POA: Insufficient documentation

## 2016-01-06 ENCOUNTER — Encounter: Payer: Self-pay | Admitting: Family

## 2016-01-06 DIAGNOSIS — Z3A25 25 weeks gestation of pregnancy: Secondary | ICD-10-CM | POA: Diagnosis not present

## 2016-01-06 DIAGNOSIS — O283 Abnormal ultrasonic finding on antenatal screening of mother: Secondary | ICD-10-CM | POA: Diagnosis not present

## 2016-01-06 DIAGNOSIS — O358XX0 Maternal care for other (suspected) fetal abnormality and damage, not applicable or unspecified: Secondary | ICD-10-CM | POA: Diagnosis not present

## 2016-01-10 ENCOUNTER — Encounter: Payer: Self-pay | Admitting: Family

## 2016-01-10 DIAGNOSIS — O283 Abnormal ultrasonic finding on antenatal screening of mother: Secondary | ICD-10-CM | POA: Insufficient documentation

## 2016-01-21 ENCOUNTER — Ambulatory Visit (INDEPENDENT_AMBULATORY_CARE_PROVIDER_SITE_OTHER): Payer: 59 | Admitting: Advanced Practice Midwife

## 2016-01-21 ENCOUNTER — Encounter: Payer: Self-pay | Admitting: *Deleted

## 2016-01-21 VITALS — BP 109/69 | HR 98 | Wt 130.0 lb

## 2016-01-21 DIAGNOSIS — Z36 Encounter for antenatal screening of mother: Secondary | ICD-10-CM | POA: Diagnosis not present

## 2016-01-21 DIAGNOSIS — O34219 Maternal care for unspecified type scar from previous cesarean delivery: Secondary | ICD-10-CM

## 2016-01-21 DIAGNOSIS — Z23 Encounter for immunization: Secondary | ICD-10-CM

## 2016-01-21 DIAGNOSIS — Z3482 Encounter for supervision of other normal pregnancy, second trimester: Secondary | ICD-10-CM

## 2016-01-21 DIAGNOSIS — Z3492 Encounter for supervision of normal pregnancy, unspecified, second trimester: Secondary | ICD-10-CM

## 2016-01-21 NOTE — Progress Notes (Signed)
Pt had Tdap today but coming back next week for 28 week labs

## 2016-01-21 NOTE — Patient Instructions (Addendum)
BABYSCRIPTS PATIENT: [ ]  initial, [ ]  12, [ ]  20, [ ]  28, [ ]  32, [ ]  36, [ ]  38, [ ]  39, [ ]  40  Tdap Vaccine (Tetanus, Diphtheria and Pertussis): What You Need to Know 1. Why get vaccinated? Tetanus, diphtheria and pertussis are very serious diseases. Tdap vaccine can protect [place] from these diseases. And, Tdap vaccine given to pregnant women can protect newborn babies against pertussis. TETANUS (Lockjaw) is rare in the [place] today. It causes painful muscle tightening and stiffness, usually all over the body.  It can lead to tightening of muscles in the head and neck so you can't open your mouth, swallow, or sometimes even breathe. Tetanus kills about 1 out of 10 people who are infected even after receiving the best medical care. DIPHTHERIA is also rare in the [place] today. It can cause a thick coating to form in the back of the throat.  It can lead to breathing problems, heart failure, paralysis, and death. PERTUSSIS (Whooping Cough) causes severe coughing spells, which can cause difficulty breathing, vomiting and disturbed sleep.  It can also lead to weight loss, incontinence, and rib fractures. Up to 2 in 100 adolescents and 5 in 100 adults with pertussis are hospitalized or have complications, which could include pneumonia or death. These diseases are caused by bacteria. Diphtheria and pertussis are spread from person to person through secretions from coughing or sneezing. Tetanus enters the body through cuts, scratches, or wounds. Before vaccines, as many as 200,000 cases of diphtheria, 200,000 cases of pertussis, and hundreds of cases of tetanus, were reported in the [place] each year. Since vaccination began, reports of cases for tetanus and diphtheria have dropped by about 99% and for pertussis by about 80%. 2. Tdap vaccine Tdap vaccine can protect adolescents and adults from tetanus, diphtheria, and pertussis. One dose of Tdap is routinely given at age 33 or 109. People  who did not get Tdap at that age should get it as soon as possible. Tdap is especially important for healthcare professionals and anyone having close contact with a baby younger than 12 months. Pregnant women should get a dose of Tdap during every pregnancy, to protect the newborn from pertussis. Infants are most at risk for severe, life-threatening complications from pertussis. Another vaccine, called Td, protects against tetanus and diphtheria, but not pertussis. A Td booster should be given every 10 years. Tdap may be given as one of these boosters if you have never gotten Tdap before. Tdap may also be given after a severe cut or burn to prevent tetanus infection. Your doctor or the person giving you the vaccine can give you more information. Tdap may safely be given at the same time as other vaccines. 3. Some people should not get this vaccine  A person who has ever had a life-threatening allergic reaction after a previous dose of any diphtheria, tetanus or pertussis containing vaccine, OR has a severe allergy to any part of this vaccine, should not get Tdap vaccine. Tell the person giving the vaccine about any severe allergies.  Anyone who had coma or long repeated seizures within 7 days after a childhood dose of DTP or DTaP, or a previous dose of Tdap, should not get Tdap, unless a cause other than the vaccine was found. They can still get Td.  Talk to your doctor if you:  have seizures or another nervous system problem,  had severe pain or swelling after any vaccine containing diphtheria, tetanus or  pertussis,  ever had a condition called Guillain-Barr Syndrome (GBS),  aren't feeling well on the day the shot is scheduled. 4. Risks With any medicine, including vaccines, there is a chance of side effects. These are usually mild and go away on their own. Serious reactions are also possible but are rare. Most people who get Tdap vaccine do not have any problems with it. Mild problems  following Tdap (Did not interfere with activities)  Pain where the shot was given (about 3 in 4 adolescents or 2 in 3 adults)  Redness or swelling where the shot was given (about 1 person in 5)  Mild fever of at least 100.4F (up to about 1 in 25 adolescents or 1 in 100 adults)  Headache (about 3 or 4 people in 10)  Tiredness (about 1 person in 3 or 4)  Nausea, vomiting, diarrhea, stomach ache (up to 1 in 4 adolescents or 1 in 10 adults)  Chills, sore joints (about 1 person in 10)  Body aches (about 1 person in 3 or 4)  Rash, swollen glands (uncommon) Moderate problems following Tdap (Interfered with activities, but did not require medical attention)  Pain where the shot was given (up to 1 in 5 or 6)  Redness or swelling where the shot was given (up to about 1 in 16 adolescents or 1 in 12 adults)  Fever over 102F (about 1 in 100 adolescents or 1 in 250 adults)  Headache (about 1 in 7 adolescents or 1 in 10 adults)  Nausea, vomiting, diarrhea, stomach ache (up to 1 or 3 people in 100)  Swelling of the entire arm where the shot was given (up to about 1 in 500). Severe problems following Tdap (Unable to perform usual activities; required medical attention)  Swelling, severe pain, bleeding and redness in the arm where the shot was given (rare). Problems that could happen after any vaccine:  People sometimes faint after a medical procedure, including vaccination. Sitting or lying down for about 15 minutes can help prevent fainting, and injuries caused by a fall. Tell your doctor if you feel dizzy, or have vision changes or ringing in the ears.  Some people get severe pain in the shoulder and have difficulty moving the arm where a shot was given. This happens very rarely.  Any medication can cause a severe allergic reaction. Such reactions from a vaccine are very rare, estimated at fewer than 1 in a million doses, and would happen within a few minutes to a few hours after the  vaccination. As with any medicine, there is a very remote chance of a vaccine causing a serious injury or death. The safety of vaccines is always being monitored. For more information, visit: http://floyd.org/ 5. What if there is a serious problem? What should I look for?  Look for anything that concerns you, such as signs of a severe allergic reaction, very high fever, or unusual behavior.  Signs of a severe allergic reaction can include hives, swelling of the face and throat, difficulty breathing, a fast heartbeat, dizziness, and weakness. These would usually start a few minutes to a few hours after the vaccination. What should I do?  If you think it is a severe allergic reaction or other emergency that can't wait, call 9-1-1 or get the person to the nearest hospital. Otherwise, call your doctor.  Afterward, the reaction should be reported to the Vaccine Adverse Event Reporting System (VAERS). Your doctor might file this report, or you can do it yourself through  the VAERS web site at www.vaers.LAgents.no, or by calling 1-845-190-4507. VAERS does not give medical advice.  6. The National Vaccine Injury Compensation Program The [employer] Vaccine Injury Compensation Program (VICP) is a federal program that was created to compensate people who may have been injured by certain vaccines. Persons who believe they may have been injured by a vaccine can learn about the program and about filing a claim by calling 1-(905)377-9433 or visiting the VICP website at SpiritualWord.at. There is a time limit to file a claim for compensation. 7. How can I learn more?  Ask your doctor. He or she can give you the vaccine package insert or suggest other sources of information.  Call your local or state health department.  Contact the Centers for Disease Control and Prevention (CDC):  Call 254-425-0982 (1-800-CDC-INFO) or  Visit CDC's website at PicCapture.uy CDC Tdap Vaccine VIS  (02/17/14)   This information is not intended to replace advice given to you by your health care provider. Make sure you discuss any questions you have with your health care provider.   Document Released: 06/11/2012 Document Revised: 01/01/2015 Document Reviewed: 03/25/2014 Elsevier Interactive Patient Education [employer]   Third Trimester of Pregnancy The third trimester is from week 29 through week 42, months 7 through 9. The third trimester is a time when the fetus is growing rapidly. At the end of the ninth month, the fetus is about 20 inches in length and weighs 6-10 pounds.  BODY CHANGES Your body goes through many changes during pregnancy. The changes vary from woman to woman.   Your weight will continue to increase. You can expect to gain 25-35 pounds (11-16 kg) by the end of the pregnancy.  You may begin to get stretch marks on your hips, abdomen, and breasts.  You may urinate more often because the fetus is moving lower into your pelvis and pressing on your bladder.  You may develop or continue to have heartburn as a result of your pregnancy.  You may develop constipation because certain hormones are causing the muscles that push waste through your intestines to slow down.  You may develop hemorrhoids or swollen, bulging veins (varicose veins).  You may have pelvic pain because of the weight gain and pregnancy hormones relaxing your joints between the bones in your pelvis. Backaches may result from overexertion of the muscles supporting your posture.  You may have changes in your hair. These can include thickening of your hair, rapid growth, and changes in texture. Some women also have hair loss during or after pregnancy, or hair that feels dry or thin. Your hair will most likely return to normal after your baby is born.  Your breasts will continue to grow and be tender. A yellow discharge may leak from your breasts called colostrum.  Your belly button may stick  out.  You may feel short of breath because of your expanding uterus.  You may notice the fetus "dropping," or moving lower in your abdomen.  You may have a bloody mucus discharge. This usually occurs a few days to a week before labor begins.  Your cervix becomes thin and soft (effaced) near your due date. WHAT TO EXPECT AT YOUR PRENATAL EXAMS  You will have prenatal exams every 2 weeks until week 36. Then, you will have weekly prenatal exams. During a routine prenatal visit:  You will be weighed to make sure you and the fetus are growing normally.  Your blood pressure is taken.  Your abdomen will  be measured to track your baby's growth.  The fetal heartbeat will be listened to.  Any test results from the previous visit will be discussed.  You may have a cervical check near your due date to see if you have effaced. At around 36 weeks, your caregiver will check your cervix. At the same time, your caregiver will also perform a test on the secretions of the vaginal tissue. This test is to determine if a type of bacteria, Group B streptococcus, is present. Your caregiver will explain this further. Your caregiver may ask you:  What your birth plan is.  How you are feeling.  If you are feeling the baby move.  If you have had any abnormal symptoms, such as leaking fluid, bleeding, severe headaches, or abdominal cramping.  If you are using any tobacco products, including cigarettes, chewing tobacco, and electronic cigarettes.  If you have any questions. Other tests or screenings that may be performed during your third trimester include:  Blood tests that check for low iron levels (anemia).  Fetal testing to check the health, activity level, and growth of the fetus. Testing is done if you have certain medical conditions or if there are problems during the pregnancy.  HIV (human immunodeficiency virus) testing. If you are at high risk, you may be screened for HIV during your third  trimester of pregnancy. FALSE LABOR You may feel small, irregular contractions that eventually go away. These are called Braxton Hicks contractions, or false labor. Contractions may last for hours, days, or even weeks before true labor sets in. If contractions come at regular intervals, intensify, or become painful, it is best to be seen by your caregiver.  SIGNS OF LABOR   Menstrual-like cramps.  Contractions that are 5 minutes apart or less.  Contractions that start on the top of the uterus and spread down to the lower abdomen and back.  A sense of increased pelvic pressure or back pain.  A watery or bloody mucus discharge that comes from the vagina. If you have any of these signs before the 37th week of pregnancy, call your caregiver right away. You need to go to the hospital to get checked immediately. HOME CARE INSTRUCTIONS   Avoid all smoking, herbs, alcohol, and unprescribed drugs. These chemicals affect the formation and growth of the baby.  Do not use any tobacco products, including cigarettes, chewing tobacco, and electronic cigarettes. If you need help quitting, ask your health care provider. You may receive counseling support and other resources to help you quit.  Follow your caregiver's instructions regarding medicine use. There are medicines that are either safe or unsafe to take during pregnancy.  Exercise only as directed by your caregiver. Experiencing uterine cramps is a good sign to stop exercising.  Continue to eat regular, healthy meals.  Wear a good support bra for breast tenderness.  Do not use hot tubs, steam rooms, or saunas.  Wear your seat belt at all times when driving.  Avoid raw meat, uncooked cheese, cat litter boxes, and soil used by cats. These carry germs that can cause birth defects in the baby.  Take your prenatal vitamins.  Take 1500-2000 mg of calcium daily starting at the 20th week of pregnancy until you deliver your baby.  Try taking a  stool softener (if your caregiver approves) if you develop constipation. Eat more high-fiber foods, such as fresh vegetables or fruit and whole grains. Drink plenty of fluids to keep your urine clear or pale yellow.  Take  warm sitz baths to soothe any pain or discomfort caused by hemorrhoids. Use hemorrhoid cream if your caregiver approves.  If you develop varicose veins, wear support hose. Elevate your feet for 15 minutes, 3-4 times a day. Limit salt in your diet.  Avoid heavy lifting, wear low heal shoes, and practice good posture.  Rest a lot with your legs elevated if you have leg cramps or low back pain.  Visit your dentist if you have not gone during your pregnancy. Use a soft toothbrush to brush your teeth and be gentle when you floss.  A sexual relationship may be continued unless your caregiver directs you otherwise.  Do not travel far distances unless it is absolutely necessary and only with the approval of your caregiver.  Take prenatal classes to understand, practice, and ask questions about the labor and delivery.  Make a trial run to the hospital.  Pack your hospital bag.  Prepare the baby's nursery.  Continue to go to all your prenatal visits as directed by your caregiver. SEEK MEDICAL CARE IF:  You are unsure if you are in labor or if your water has broken.  You have dizziness.  You have mild pelvic cramps, pelvic pressure, or nagging pain in your abdominal area.  You have persistent nausea, vomiting, or diarrhea.  You have a bad smelling vaginal discharge.  You have pain with urination. SEEK IMMEDIATE MEDICAL CARE IF:   You have a fever.  You are leaking fluid from your vagina.  You have spotting or bleeding from your vagina.  You have severe abdominal cramping or pain.  You have rapid weight loss or gain.  You have shortness of breath with chest pain.  You notice sudden or extreme swelling of your face, hands, ankles, feet, or legs.  You have  not felt your baby move in over an hour.  You have severe headaches that do not go away with medicine.  You have vision changes.   This information is not intended to replace advice given to you by your health care provider. Make sure you discuss any questions you have with your health care provider.   Document Released: 12/05/2001 Document Revised: 01/01/2015 Document Reviewed: 02/11/2013 Elsevier Interactive Patient Education [employer]

## 2016-01-21 NOTE — Progress Notes (Signed)
Subjective:  Paige Gamble is a 30 y.o. G2P1001 at [redacted]w[redacted]d being seen today for ongoing prenatal care.  She is currently monitored for the following issues for this low-risk pregnancy and has History of urinary anomaly; H/O anxiety state; Error, refractive, myopia; Cyanocobalamine deficiency (non anemic); History of cesarean section; Supervision of normal pregnancy; Previous cesarean delivery affecting pregnancy, antepartum; and Increased nuchal translucency space on fetal ultrasound on her problem list.  Patient reports no complaints.  Contractions: Not present. Vag. Bleeding: None.  Movement: Present. Denies leaking of fluid.   Pt is post-call and would like to came back for 28 week labs at a later date.   The following portions of the patient's history were reviewed and updated as appropriate: allergies, current medications, past family history, past medical history, past social history, past surgical history and problem list. Problem list updated.  Objective:   Filed Vitals:   01/21/16 0823  BP: 109/69  Pulse: 98  Weight: 130 lb (58.968 kg)    Fetal Status: Fetal Heart Rate (bpm): 132   Movement: Present     General:  Alert, oriented and cooperative. Patient is in no acute distress.  Skin: Skin is warm and dry. No rash noted.   Cardiovascular: Normal heart rate noted  Respiratory: Normal respiratory effort, no problems with respiration noted  Abdomen: Soft, gravid, appropriate for gestational age. Pain/Pressure: Present     Pelvic: Vag. Bleeding: None Vag D/C Character: Thin   Cervical exam deferred        Extremities: Normal range of motion.  Edema: None  Mental Status: Normal mood and affect. Normal behavior. Normal judgment and thought content.   Urinalysis: Urine Protein: Negative Urine Glucose: Negative  Assessment and Plan:  Pregnancy: G2P1001 at [redacted]w[redacted]d  1. Normal pregnancy, second trimester  - Tdap vaccine greater than or equal to 7yo IM - Glucose Tolerance, 1 HR (50g) -  CBC - HIV antibody (with reflex) - RPR  2. Previous cesarean delivery affecting pregnancy, antepartum   3. Supervision of normal pregnancy, second trimester   Preterm labor symptoms and general obstetric precautions including but not limited to vaginal bleeding, contractions, leaking of fluid and fetal movement were reviewed in detail with the patient. Please refer to After Visit Summary for other counseling recommendations.  Will return for 28 week labs.  Return in about 4 weeks (around 02/18/2016).   Dorathy Kinsman, CNM

## 2016-01-24 ENCOUNTER — Other Ambulatory Visit (INDEPENDENT_AMBULATORY_CARE_PROVIDER_SITE_OTHER): Payer: 59

## 2016-01-24 DIAGNOSIS — Z36 Encounter for antenatal screening of mother: Secondary | ICD-10-CM | POA: Diagnosis not present

## 2016-01-24 DIAGNOSIS — Z3492 Encounter for supervision of normal pregnancy, unspecified, second trimester: Secondary | ICD-10-CM | POA: Diagnosis not present

## 2016-01-24 DIAGNOSIS — Z3480 Encounter for supervision of other normal pregnancy, unspecified trimester: Secondary | ICD-10-CM | POA: Diagnosis not present

## 2016-01-24 LAB — CBC
HCT: 31.4 % — ABNORMAL LOW (ref 36.0–46.0)
Hemoglobin: 10.3 g/dL — ABNORMAL LOW (ref 12.0–15.0)
MCH: 24.7 pg — ABNORMAL LOW (ref 26.0–34.0)
MCHC: 32.8 g/dL (ref 30.0–36.0)
MCV: 75.3 fL — ABNORMAL LOW (ref 78.0–100.0)
MPV: 10 fL (ref 8.6–12.4)
PLATELETS: 266 10*3/uL (ref 150–400)
RBC: 4.17 MIL/uL (ref 3.87–5.11)
RDW: 13.5 % (ref 11.5–15.5)
WBC: 10 10*3/uL (ref 4.0–10.5)

## 2016-01-25 ENCOUNTER — Telehealth: Payer: Self-pay | Admitting: *Deleted

## 2016-01-25 LAB — RPR

## 2016-01-25 LAB — GLUCOSE TOLERANCE, 1 HOUR (50G) W/O FASTING: GLUCOSE 1 HOUR GTT: 174 mg/dL — AB (ref 70–140)

## 2016-01-25 LAB — HIV ANTIBODY (ROUTINE TESTING W REFLEX): HIV: NONREACTIVE

## 2016-01-25 NOTE — Telephone Encounter (Signed)
LM on voicemail to call office to discuss 1 hr GTT.  Pt's level were elevated and she will need to call office to schedule a fasting 3 hr GTT.

## 2016-01-26 DIAGNOSIS — R7302 Impaired glucose tolerance (oral): Secondary | ICD-10-CM | POA: Diagnosis not present

## 2016-01-28 ENCOUNTER — Other Ambulatory Visit: Payer: 59

## 2016-01-29 LAB — GLUCOSE TOLERANCE, 3 HOURS
GLUCOSE 3 HOUR GTT: 149 mg/dL — AB (ref 70–144)
GLUCOSE, 1 HOUR-GESTATIONAL: 193 mg/dL — AB (ref 70–189)
GLUCOSE, FASTING-GESTATIONAL: 77 mg/dL (ref 65–99)
Glucose Tolerance, 2 hour: 185 mg/dL — ABNORMAL HIGH (ref 70–164)

## 2016-01-31 ENCOUNTER — Encounter: Payer: Self-pay | Admitting: *Deleted

## 2016-01-31 ENCOUNTER — Telehealth: Payer: Self-pay | Admitting: *Deleted

## 2016-01-31 ENCOUNTER — Encounter: Payer: Self-pay | Admitting: Family

## 2016-01-31 DIAGNOSIS — F419 Anxiety disorder, unspecified: Secondary | ICD-10-CM | POA: Diagnosis not present

## 2016-01-31 DIAGNOSIS — O24419 Gestational diabetes mellitus in pregnancy, unspecified control: Secondary | ICD-10-CM | POA: Diagnosis not present

## 2016-01-31 DIAGNOSIS — Z3A29 29 weeks gestation of pregnancy: Secondary | ICD-10-CM | POA: Diagnosis not present

## 2016-01-31 DIAGNOSIS — Z0000 Encounter for general adult medical examination without abnormal findings: Secondary | ICD-10-CM | POA: Diagnosis not present

## 2016-01-31 NOTE — Telephone Encounter (Signed)
Pt notified of abnormal 3 hr GTT.  [facility]ulatory [facility].

## 2016-02-09 ENCOUNTER — Encounter: Payer: 59 | Attending: Advanced Practice Midwife

## 2016-02-09 ENCOUNTER — Encounter: Payer: Self-pay | Admitting: *Deleted

## 2016-02-09 ENCOUNTER — Telehealth: Payer: Self-pay | Admitting: *Deleted

## 2016-02-09 VITALS — Ht 60.0 in | Wt 131.2 lb

## 2016-02-09 DIAGNOSIS — O24419 Gestational diabetes mellitus in pregnancy, unspecified control: Secondary | ICD-10-CM | POA: Diagnosis not present

## 2016-02-09 DIAGNOSIS — Z3492 Encounter for supervision of normal pregnancy, unspecified, second trimester: Secondary | ICD-10-CM

## 2016-02-09 MED ORDER — LANCET DEVICE MISC: 1.0000 | Freq: Four times a day (QID) | Status: DC

## 2016-02-09 MED ORDER — GLUCOSE BLOOD VI STRP: ORAL_STRIP | Status: DC

## 2016-02-09 MED FILL — TRUEplus LANCETS 30G MISC: 25 days supply | Qty: 100 | Fill #0

## 2016-02-09 MED FILL — TRUE METRIX GLUCOSE TEST ST: 25 days supply | Qty: 100 | Fill #0

## 2016-02-09 NOTE — Telephone Encounter (Signed)
Removed pt from [facility] program due to GDM. Removed BRX from problem list. Letter sent to pt to adv BRX equip will continue to work but be unmonitored.

## 2016-02-09 NOTE — Telephone Encounter (Signed)
Pt called in stating she needs lancets and test strips.  True metrix meter was given

## 2016-02-10 NOTE — Progress Notes (Signed)
  Patient was seen on 02/09/16 for Gestational Diabetes self-management . The following learning objectives were met by the patient :   States the definition of Gestational Diabetes  States why dietary management is important in controlling blood glucose  Describes the effects of carbohydrates on blood glucose levels  Demonstrates ability to create a balanced meal plan  Demonstrates carbohydrate counting   States when to check blood glucose levels  Demonstrates proper blood glucose monitoring techniques  States the effect of stress and exercise on blood glucose levels  States the importance of limiting caffeine and abstaining from alcohol and smoking  Plan:  Aim for 2 Carb Choices per meal (30 grams) +/- 1 either way for breakfast Aim for 3 Carb Choices per meal (45 grams) +/- 1 either way from lunch and dinner Aim for 1-2 Carbs per snack Begin reading food labels for Total Carbohydrate and sugar grams of foods Consider  increasing your activity level by walking daily as tolerated Begin checking BG before breakfast and 1-2 hours after first bit of breakfast, lunch and dinner after  as directed by MD  Take medication  as directed by MD  Blood glucose monitor given: True Metrix Lot # [id number] Exp: 11/23/2016 Blood glucose reading: '92mg'$ /dl  Patient instructed to monitor glucose levels: FBS: 60 - <90 1 hour: <140 2 hour: <120  Patient received the following handouts:  Nutrition Diabetes and Pregnancy  Carbohydrate Counting List  Meal Planning worksheet  Patient will be seen for follow-up as needed.

## 2016-02-12 ENCOUNTER — Encounter: Payer: Self-pay | Admitting: *Deleted

## 2016-02-14 NOTE — Addendum Note (Signed)
Addended by: Rosendo Gros on: 02/14/2016 11:41 AM   Modules accepted: Orders, Medications

## 2016-02-18 ENCOUNTER — Ambulatory Visit (INDEPENDENT_AMBULATORY_CARE_PROVIDER_SITE_OTHER): Payer: 59 | Admitting: Family

## 2016-02-18 ENCOUNTER — Encounter: Payer: Self-pay | Admitting: Family

## 2016-02-18 VITALS — BP 104/62 | HR 86 | Wt 133.0 lb

## 2016-02-18 DIAGNOSIS — O34219 Maternal care for unspecified type scar from previous cesarean delivery: Secondary | ICD-10-CM

## 2016-02-18 DIAGNOSIS — O99019 Anemia complicating pregnancy, unspecified trimester: Secondary | ICD-10-CM | POA: Insufficient documentation

## 2016-02-18 DIAGNOSIS — Z3493 Encounter for supervision of normal pregnancy, unspecified, third trimester: Secondary | ICD-10-CM

## 2016-02-18 DIAGNOSIS — O24419 Gestational diabetes mellitus in pregnancy, unspecified control: Secondary | ICD-10-CM

## 2016-02-18 DIAGNOSIS — Z3483 Encounter for supervision of other normal pregnancy, third trimester: Secondary | ICD-10-CM

## 2016-02-18 MED ORDER — FUSION PLUS PO CAPS: 1.0000 | ORAL_CAPSULE | Freq: Every day | ORAL | Status: DC

## 2016-02-18 MED FILL — FUSION PLUS CAPSULE: 30 days supply | Qty: 30 | Fill #0

## 2016-02-18 NOTE — Progress Notes (Signed)
Subjective:  Paige Gamble is a 30 y.o. G2P1001 at [redacted]w[redacted]d being seen today for ongoing prenatal care.  She is currently monitored for the following issues for this high-risk pregnancy and has History of urinary anomaly; H/O anxiety state; Error, refractive, myopia; Cyanocobalamine deficiency (non anemic); History of cesarean section; Supervision of normal pregnancy; Previous cesarean delivery affecting pregnancy, antepartum; Increased nuchal translucency space on fetal ultrasound; Gestational diabetes; and Gestational diabetes mellitus (GDM) affecting pregnancy on her problem list.  Patient reports no complaints.  Contractions: Not present. Vag. Bleeding: None.  Movement: Absent. Denies leaking of fluid.   The following portions of the patient's history were reviewed and updated as appropriate: allergies, current medications, past family history, past medical history, past social history, past surgical history and problem list. Problem list updated.  Objective:   Filed Vitals:   02/18/16 0812  BP: 104/62  Pulse: 86  Weight: 133 lb (60.328 kg)    Fetal Status: Fetal Heart Rate (bpm): 143   Movement: Absent     General:  Alert, oriented and cooperative. Patient is in no acute distress.  Skin: Skin is warm and dry. No rash noted.   Cardiovascular: Normal heart rate noted  Respiratory: Normal respiratory effort, no problems with respiration noted  Abdomen: Soft, gravid, appropriate for gestational age. Pain/Pressure: Present     Pelvic: Vag. Bleeding: None Vag D/C Character: Thin   Cervical exam deferred        Extremities: Normal range of motion.  Edema: None  Mental Status: Normal mood and affect. Normal behavior. Normal judgment and thought content.   Urinalysis: Urine Protein: Negative Urine Glucose: Negative  Fasting PPB PPL PPD  All wnl All wnl All wnl All wnl    Assessment and Plan:  Pregnancy: G2P1001 at [redacted]w[redacted]d  1. Supervision of normal pregnancy, third trimester - Discussed  fetal movment  2. Previous cesarean delivery affecting pregnancy, antepartum - Discussed potential implications of induction on TOLAC   3. Gestational diabetes mellitus (GDM) affecting pregnancy - Doing well with diet - Encouraged ambulation/walking  4.  Anemia in Pregnancy - RX Fusion Plus  Preterm labor symptoms and general obstetric precautions including but not limited to vaginal bleeding, contractions, leaking of fluid and fetal movement were reviewed in detail with the patient. Please refer to After Visit Summary for other counseling recommendations.  Return in about 2 weeks (around 03/03/2016).   Eino Farber Kennith Gain, CNM

## 2016-02-18 NOTE — Progress Notes (Signed)
Ketones trace

## 2016-02-22 ENCOUNTER — Encounter: Payer: Self-pay | Admitting: *Deleted

## 2016-03-02 MED FILL — TRUEplus LANCETS 30G MISC: 25 days supply | Qty: 100 | Fill #1

## 2016-03-02 MED FILL — TRUE METRIX GLUCOSE TEST ST: 25 days supply | Qty: 100 | Fill #1

## 2016-03-06 ENCOUNTER — Ambulatory Visit (INDEPENDENT_AMBULATORY_CARE_PROVIDER_SITE_OTHER): Payer: 59 | Admitting: Certified Nurse Midwife

## 2016-03-06 VITALS — BP 117/68 | HR 111 | Wt 133.0 lb

## 2016-03-06 DIAGNOSIS — Z3483 Encounter for supervision of other normal pregnancy, third trimester: Secondary | ICD-10-CM

## 2016-03-06 DIAGNOSIS — O24410 Gestational diabetes mellitus in pregnancy, diet controlled: Secondary | ICD-10-CM

## 2016-03-06 DIAGNOSIS — Z3493 Encounter for supervision of normal pregnancy, unspecified, third trimester: Secondary | ICD-10-CM

## 2016-03-06 DIAGNOSIS — O34219 Maternal care for unspecified type scar from previous cesarean delivery: Secondary | ICD-10-CM

## 2016-03-06 NOTE — Patient Instructions (Signed)

## 2016-03-06 NOTE — Progress Notes (Signed)
Subjective:  Paige Gamble is a 30 y.o. G2P1001 at 8060w2d being seen today for ongoing prenatal care.  She is currently monitored for the following issues for this high-risk pregnancy and has History of urinary anomaly; H/O anxiety state; Error, refractive, myopia; Cyanocobalamine deficiency (non anemic); History of cesarean section; Supervision of normal pregnancy; Previous cesarean delivery affecting pregnancy, antepartum; Increased nuchal translucency space on fetal ultrasound; Gestational diabetes; Gestational diabetes mellitus (GDM) affecting pregnancy; and Maternal anemia in pregnancy, antepartum on her problem list.  Patient reports no complaints.  Contractions: Irregular. Vag. Bleeding: None.  Movement: Present. Denies leaking of fluid.   The following portions of the patient's history were reviewed and updated as appropriate: allergies, current medications, past family history, past medical history, past social history, past surgical history and problem list. Problem list updated.  Objective:   Filed Vitals:   03/06/16 0818  BP: 117/68  Pulse: 111  Weight: 133 lb (60.328 kg)    Fetal Status: Fetal Heart Rate (bpm): 145 Fundal Height: 34 cm Movement: Present     General:  Alert, oriented and cooperative. Patient is in no acute distress.  Skin: Skin is warm and dry. No rash noted.   Cardiovascular: Normal heart rate noted  Respiratory: Normal respiratory effort, no problems with respiration noted  Abdomen: Soft, gravid, appropriate for gestational age. Pain/Pressure: Present     Pelvic: Vag. Bleeding: None Vag D/C Character: Thin   Cervical exam deferred        Extremities: Normal range of motion.  Edema: Trace  Mental Status: Normal mood and affect. Normal behavior. Normal judgment and thought content.   Urinalysis: Urine Protein: Negative Urine Glucose: Negative  Assessment and Plan:  Pregnancy: G2P1001 at 4060w2d  1. Previous cesarean delivery affecting pregnancy,  antepartum  - US MFM OB FOLLOW UP; Future  2. Supervision of normal pregnancy, third trimester  - US MFM OB FOLLOW UP; Future  3. Diet controlled gestational diabetes mellitus in third trimester  - US MFM OB FOLLOW UP; Future  Preterm labor symptoms and general obstetric precautions including but not limited to vaginal bleeding, contractions, leaking of fluid and fetal movement were reviewed in detail with the patient. Please refer to After Visit Summary for other counseling recommendations.  Return in about 2 weeks (around 03/20/2016).   Rhea PinkLori A Clemmons, CNM

## 2016-03-14 ENCOUNTER — Ambulatory Visit (HOSPITAL_COMMUNITY)
Admission: RE | Admit: 2016-03-14 | Discharge: 2016-03-14 | Disposition: A | Discharge status: Home / Self Care | Payer: 59 | Source: Ambulatory Visit | Attending: Certified Nurse Midwife | Admitting: Certified Nurse Midwife

## 2016-03-14 ENCOUNTER — Other Ambulatory Visit: Payer: Self-pay | Admitting: Certified Nurse Midwife

## 2016-03-14 DIAGNOSIS — O283 Abnormal ultrasonic finding on antenatal screening of mother: Secondary | ICD-10-CM | POA: Insufficient documentation

## 2016-03-14 DIAGNOSIS — O24410 Gestational diabetes mellitus in pregnancy, diet controlled: Secondary | ICD-10-CM

## 2016-03-14 DIAGNOSIS — O34219 Maternal care for unspecified type scar from previous cesarean delivery: Secondary | ICD-10-CM

## 2016-03-14 DIAGNOSIS — O289 Unspecified abnormal findings on antenatal screening of mother: Secondary | ICD-10-CM | POA: Diagnosis not present

## 2016-03-14 DIAGNOSIS — Z3A35 35 weeks gestation of pregnancy: Secondary | ICD-10-CM

## 2016-03-14 DIAGNOSIS — Z3493 Encounter for supervision of normal pregnancy, unspecified, third trimester: Secondary | ICD-10-CM

## 2016-03-20 ENCOUNTER — Ambulatory Visit (INDEPENDENT_AMBULATORY_CARE_PROVIDER_SITE_OTHER): Payer: 59 | Admitting: Advanced Practice Midwife

## 2016-03-20 VITALS — BP 101/60 | HR 82 | Wt 133.0 lb

## 2016-03-20 DIAGNOSIS — Z3493 Encounter for supervision of normal pregnancy, unspecified, third trimester: Secondary | ICD-10-CM

## 2016-03-20 DIAGNOSIS — Z3483 Encounter for supervision of other normal pregnancy, third trimester: Secondary | ICD-10-CM

## 2016-03-20 DIAGNOSIS — Z36 Encounter for antenatal screening of mother: Secondary | ICD-10-CM

## 2016-03-20 DIAGNOSIS — O23593 Infection of other part of genital tract in pregnancy, third trimester: Secondary | ICD-10-CM

## 2016-03-20 DIAGNOSIS — O24419 Gestational diabetes mellitus in pregnancy, unspecified control: Secondary | ICD-10-CM

## 2016-03-20 DIAGNOSIS — N760 Acute vaginitis: Secondary | ICD-10-CM

## 2016-03-20 LAB — OB RESULTS CONSOLE GBS: STREP GROUP B AG: NEGATIVE

## 2016-03-20 LAB — OB RESULTS CONSOLE GC/CHLAMYDIA
CHLAMYDIA, DNA PROBE: NEGATIVE
GC PROBE AMP, GENITAL: NEGATIVE

## 2016-03-20 NOTE — Patient Instructions (Signed)
Braxton Hicks Contractions °Contractions of the uterus can occur throughout pregnancy. Contractions are not always a sign that you are in labor.  °WHAT ARE BRAXTON HICKS CONTRACTIONS?  °Contractions that occur before labor are called Braxton Hicks contractions, or false labor. Toward the end of pregnancy (32-34 weeks), these contractions can develop more often and may become more forceful. This is not true labor because these contractions do not result in opening (dilatation) and thinning of the cervix. They are sometimes difficult to tell apart from true labor because these contractions can be forceful and people have different pain tolerances. You should not feel embarrassed if you go to the hospital with false labor. Sometimes, the only way to tell if you are in true labor is for your health care provider to look for changes in the cervix. °If there are no prenatal problems or other health problems associated with the pregnancy, it is completely safe to be sent home with false labor and await the onset of true labor. °HOW CAN YOU TELL THE DIFFERENCE BETWEEN TRUE AND FALSE LABOR? °False Labor °· The contractions of false labor are usually shorter and not as hard as those of true labor.   °· The contractions are usually irregular.   °· The contractions are often felt in the front of the lower abdomen and in the groin.   °· The contractions may go away when you walk around or change positions while lying down.   °· The contractions get weaker and are shorter lasting as time goes on.   °· The contractions do not usually become progressively stronger, regular, and closer together as with true labor.   °True Labor °· Contractions in true labor last 30-70 seconds, become very regular, usually become more intense, and increase in frequency.   °· The contractions do not go away with walking.   °· The discomfort is usually felt in the top of the uterus and spreads to the lower abdomen and low back.   °· True labor can be  determined by your health care provider with an exam. This will show that the cervix is dilating and getting thinner.   °WHAT TO REMEMBER °· Keep up with your usual exercises and follow other instructions given by your health care provider.   °· Take medicines as directed by your health care provider.   °· Keep your regular prenatal appointments.   °· Eat and drink lightly if you think you are going into labor.   °· If Braxton Hicks contractions are making you uncomfortable:   °¨ Change your position from lying down or resting to walking, or from walking to resting.   °¨ Sit and rest in a tub of warm water.   °¨ Drink 2-3 glasses of water. Dehydration may cause these contractions.   °¨ Do slow and deep breathing several times an hour.   °WHEN SHOULD I SEEK IMMEDIATE MEDICAL CARE? °Seek immediate medical care if: °· Your contractions become stronger, more regular, and closer together.   °· You have fluid leaking or gushing from your vagina.   °· You have a fever.   °· You pass blood-tinged mucus.   °· You have vaginal bleeding.   °· You have continuous abdominal pain.   °· You have low back pain that you never had before.   °· You feel your baby's head pushing down and causing pelvic pressure.   °· Your baby is not moving as much as it used to.   °  °This information is not intended to replace advice given to you by your health care provider. Make sure you discuss any questions you have with your health care   provider. °  °Document Released: 12/11/2005 Document Revised: 12/16/2013 Document Reviewed: 09/22/2013 °Elsevier Interactive Patient Education ©2016 [employer] ° °

## 2016-03-20 NOTE — Progress Notes (Signed)
Fasting CBGs:  (0% out of range) PCB:  (<20% out of range) few 150's PCL:  (<20% out of range) PCD: (<20% out of range)  Encouraged to bring log  Subjective:  Paige Gamble is a 30 y.o. G2P1001 at 7843w2d being seen today for ongoing prenatal care.  She is currently monitored for the following issues for this high-risk pregnancy and has History of urinary anomaly; H/O anxiety state; Error, refractive, myopia; Cyanocobalamine deficiency (non anemic); History of cesarean section; Supervision of normal pregnancy; Previous cesarean delivery affecting pregnancy, antepartum; Increased nuchal translucency space on fetal ultrasound; Gestational diabetes; Gestational diabetes mellitus (GDM) affecting pregnancy; and Maternal anemia in pregnancy, antepartum on her problem list.  Patient reports occasional contractions, possible yeast infection. Took Monistat 7, resolved, returned. Also feeling some dampness in underwear, but not sure what color. Not needing to wear a pad.  Contractions: Irregular.  .  Movement: Present. Denies leaking of fluid.   The following portions of the patient's history were reviewed and updated as appropriate: allergies, current medications, past family history, past medical history, past social history, past surgical history and problem list. Problem list updated.  Objective:   Filed Vitals:   03/20/16 0848  BP: 101/60  Pulse: 82  Weight: 133 lb (60.328 kg)    Fetal Status: Fetal Heart Rate (bpm): 131 Fundal Height: 36 cm Movement: Present  Presentation: Vertex  General:  Alert, oriented and cooperative. Patient is in no acute distress.  Skin: Skin is warm and dry. No rash noted.   Cardiovascular: Normal heart rate noted  Respiratory: Normal respiratory effort, no problems with respiration noted  Abdomen: Soft, gravid, appropriate for gestational age. Pain/Pressure: Present     Pelvic:       Cervical exam performed Dilation: Fingertip Effacement (%): 0 Station:  Ballotable0.5/0/ballotable. Moderate thick, white D/C. Neg pooling.   Extremities: Normal range of motion.  Edema: Trace  Mental Status: Normal mood and affect. Normal behavior. Normal judgment and thought content.   Urinalysis: Urine Protein: Negative Urine Glucose: Negative  Assessment and Plan:  Pregnancy: G2P1001 at 5443w2d  1. Supervision of normal pregnancy, third trimester  - GC/Chlamydia Probe Amp - Culture, beta strep (group b only)  2. Vaginitis affecting pregnancy in third trimester, antepartum - WET PREP BY MOLECULAR PROBE  Term labor symptoms and general obstetric precautions including but not limited to vaginal bleeding, contractions, leaking of fluid and fetal movement were reviewed in detail with the patient. Please refer to After Visit Summary for other counseling recommendations.  F/U 1 week   [place] Maelle Sheaffer, [place]

## 2016-03-21 ENCOUNTER — Encounter: Payer: Self-pay | Admitting: Advanced Practice Midwife

## 2016-03-21 LAB — GC/CHLAMYDIA PROBE AMP
CT PROBE, AMP APTIMA: NOT DETECTED
GC PROBE AMP APTIMA: NOT DETECTED

## 2016-03-21 LAB — WET PREP BY MOLECULAR PROBE
Candida species: NEGATIVE
Gardnerella vaginalis: NEGATIVE
Trichomonas vaginosis: NEGATIVE

## 2016-03-23 ENCOUNTER — Other Ambulatory Visit: Payer: Self-pay | Admitting: Advanced Practice Midwife

## 2016-03-23 DIAGNOSIS — N760 Acute vaginitis: Secondary | ICD-10-CM

## 2016-03-23 DIAGNOSIS — O23593 Infection of other part of genital tract in pregnancy, third trimester: Secondary | ICD-10-CM

## 2016-03-23 LAB — CULTURE, BETA STREP (GROUP B ONLY)

## 2016-03-23 MED ORDER — TRIAMCINOLONE ACETONIDE 0.1 % EX LOTN: 1.0000 "application " | TOPICAL_LOTION | Freq: Three times a day (TID) | CUTANEOUS | Status: DC

## 2016-03-23 MED FILL — TRIAMCINOLONE 0.1% LOTION: 0.1 | 21 days supply | Qty: 60 | Fill #0

## 2016-03-23 NOTE — Progress Notes (Signed)
In response to pt email about vaginal irritation w/ neg BD Affirm, she may have contact dermatitis. I can Rx Triamcinolone to be applied to introitus and vulva BID PRN. Pt should avoid scented soaps or detergents or other potential allergens. Try soaking on bath w/ 1/2 cup baking soda.

## 2016-03-27 ENCOUNTER — Ambulatory Visit (INDEPENDENT_AMBULATORY_CARE_PROVIDER_SITE_OTHER): Payer: 59 | Admitting: Advanced Practice Midwife

## 2016-03-27 VITALS — BP 99/66 | HR 92 | Wt 134.0 lb

## 2016-03-27 DIAGNOSIS — O34219 Maternal care for unspecified type scar from previous cesarean delivery: Secondary | ICD-10-CM

## 2016-03-27 DIAGNOSIS — O09893 Supervision of other high risk pregnancies, third trimester: Secondary | ICD-10-CM

## 2016-03-27 DIAGNOSIS — O24419 Gestational diabetes mellitus in pregnancy, unspecified control: Secondary | ICD-10-CM

## 2016-03-27 MED FILL — FUSION PLUS CAPSULE: 30 days supply | Qty: 30 | Fill #1

## 2016-03-27 MED FILL — TRUE METRIX GLUCOSE TEST ST: 25 days supply | Qty: 100 | Fill #2

## 2016-03-27 NOTE — Progress Notes (Signed)
Subjective:  Paige Gamble is a 30 y.o. G2P1001 at 873w2d being seen today for ongoing prenatal care.  She is currently monitored for the following issues for this high-risk pregnancy and has History of urinary anomaly; H/O anxiety state; Error, refractive, myopia; Cyanocobalamine deficiency (non anemic); History of cesarean section; Supervision of normal pregnancy; Previous cesarean delivery affecting pregnancy, antepartum; Increased nuchal translucency space on fetal ultrasound; Gestational diabetes; Gestational diabetes mellitus (GDM) affecting pregnancy; and Maternal anemia in pregnancy, antepartum on her problem list.  Patient reports occasional contractions.  Contractions: Irritability. Vag. Bleeding: None.  Movement: Present. Denies leaking of fluid.   The following portions of the patient's history were reviewed and updated as appropriate: allergies, current medications, past family history, past medical history, past social history, past surgical history and problem list. Problem list updated.  Objective:   Filed Vitals:   03/27/16 1054  BP: 99/66  Pulse: 92  Weight: 134 lb (60.782 kg)    Fetal Status: Fetal Heart Rate (bpm): 138 Fundal Height: 38 cm Movement: Present  Presentation: Vertex  General:  Alert, oriented and cooperative. Patient is in no acute distress.  Skin: Skin is warm and dry. No rash noted.   Cardiovascular: Normal heart rate noted  Respiratory: Normal respiratory effort, no problems with respiration noted  Abdomen: Soft, gravid, appropriate for gestational age. Pain/Pressure: Present     Pelvic: Vag. Bleeding: None Vag D/C Character: Thick   Cervical exam performed Dilation: Fingertip Effacement (%): 0 Station: Ballotable  Extremities: Normal range of motion.  Edema: Trace  Mental Status: Normal mood and affect. Normal behavior. Normal judgment and thought content.   Urinalysis: Urine Protein: Negative Urine Glucose: Negative   Fasting CBGs: (0% out of  range) PCB:  (0% out of range) PCL:  (1/7 out of range) PCD: (3/4 out of range, but pt did not check several nights)  Assessment and Plan:  Pregnancy: G2P1001 at 5373w2d  1. Gestational diabetes mellitus (GDM) affecting pregnancy   2. Previous cesarean delivery affecting pregnancy, antepartum   3. Supervision of other high risk pregnancy, antepartum, third trimester   Term labor symptoms and general obstetric precautions including but not limited to vaginal bleeding, contractions, leaking of fluid and fetal movement were reviewed in detail with the patient. Please refer to After Visit Summary for other counseling recommendations.  Return in about 4 days (around 03/31/2016) for CBG check.  Strongly encouraged pt to check CBGs consistently especially after dinner when that are more often elevated. May need to start Glyburide. Discussed importance of excellent blood sugar surveillance and management to decrease risks associated w/ GDM. Pt verbalized understanding.    Dorathy Kins[place]ia Fiana Gladu, CNM

## 2016-03-27 NOTE — Patient Instructions (Signed)
Gestational Diabetes Mellitus  Gestational diabetes mellitus, often simply referred to as gestational diabetes, is a type of diabetes that some women develop during pregnancy. In gestational diabetes, the pancreas does not make enough insulin (a hormone), the cells are less responsive to the insulin that is made (insulin resistance), or both. Normally, insulin moves sugars from food into the tissue cells. The tissue cells use the sugars for energy. The lack of insulin or the lack of normal response to insulin causes excess sugars to build up in the blood instead of going into the tissue cells. As a result, high blood sugar (hyperglycemia) develops. The effect of high sugar (glucose) levels can cause many problems.   RISK FACTORS  You have an increased chance of developing gestational diabetes if you have a family history of diabetes and also have one or more of the following risk factors:  · A body mass index over 30 (obesity).  · A previous pregnancy with gestational diabetes.  · An older age at the time of pregnancy.  If blood glucose levels are kept in the normal range during pregnancy, women can have a healthy pregnancy. If your blood glucose levels are not well controlled, there may be risks to you, your unborn baby (fetus), your labor and delivery, or your newborn baby.   SYMPTOMS   If symptoms are experienced, they are much like symptoms you would normally expect during pregnancy. The symptoms of gestational diabetes include:   · Increased thirst (polydipsia).  · Increased urination (polyuria).  · Increased urination during the night (nocturia).  · Weight loss. This weight loss may be rapid.  · Frequent, recurring infections.  · Tiredness (fatigue).  · Weakness.  · Vision changes, such as blurred vision.  · Fruity smell to your breath.  · Abdominal pain.  DIAGNOSIS  Diabetes is diagnosed when blood glucose levels are increased. Your blood glucose level may be checked by one or more of the following blood  tests:  · A fasting blood glucose test. You will not be allowed to eat for at least 8 hours before a blood sample is taken.  · A random blood glucose test. Your blood glucose is checked at any time of the day regardless of when you ate.  · An oral glucose tolerance test (OGTT). Your blood glucose is measured after you have not eaten (fasted) for 1-3 hours and then after you drink a glucose-containing beverage. Since the hormones that cause insulin resistance are highest at about 24-28 weeks of a pregnancy, an OGTT is usually performed during that time. If you have risk factors, you may be screened for undiagnosed type 2 diabetes at your first prenatal visit.  TREATMENT   Gestational diabetes should be managed first with diet and exercise. Medicines may be added only if they are needed.  · You will need to take diabetes medicine or insulin daily to keep blood glucose levels in the desired range.  · You will need to match insulin dosing with exercise and healthy food choices.  If you have gestational diabetes, your treatment goal is to maintain the following blood glucose levels:  · Before meals (preprandial): at or below 95 mg/dL.  · After meals (postprandial):    One hour after a meal: at or below 140 mg/dL.    Two hours after a meal: at or below 120 mg/dL.  If you have pre-existing type 1 or type 2 diabetes, your treatment goal is to maintain the following blood glucose levels:  · Before   meals, at bedtime, and overnight: 60-99 mg/dL.  · After meals: peak of 100-129 mg/dL.  HOME CARE INSTRUCTIONS   · Have your hemoglobin A1c level checked twice a year.  · Perform daily blood glucose monitoring as directed by your health care provider. It is common to perform frequent blood glucose monitoring.  · Monitor urine ketones when you are ill and as directed by your health care provider.  · Take your diabetes medicine and insulin as directed by your health care provider to maintain your blood glucose level in the desired  range.  ¨ Never run out of diabetes medicine or insulin. It is needed every day.  ¨ Adjust insulin based on your intake of carbohydrates. Carbohydrates can raise blood glucose levels but need to be included in your diet. Carbohydrates provide vitamins, minerals, and fiber which are an essential part of a healthy diet. Carbohydrates are found in fruits, vegetables, whole grains, dairy products, legumes, and foods containing added sugars.  · Eat healthy foods. Alternate 3 meals with 3 snacks.  · Maintain a healthy weight gain. The usual total expected weight gain varies according to your prepregnancy body mass index (BMI).  · Carry a medical alert card or wear your medical alert jewelry.  · Carry a 15-gram carbohydrate snack with you at all times to treat low blood glucose (hypoglycemia). Some examples of 15-gram carbohydrate snacks include:  ¨ Glucose tablets, 3 or 4.  ¨ Glucose gel, 15-gram tube.  ¨ Raisins, 2 tablespoons (24 g).  ¨ Jelly beans, 6.  ¨ Animal crackers, 8.  ¨ Fruit juice, regular soda, or low-fat milk, 4 ounces (120 mL).  ¨ Gummy treats, 9.  · Recognize hypoglycemia. Hypoglycemia during pregnancy occurs with blood glucose levels of 60 mg/dL and below. The risk for hypoglycemia increases when fasting or skipping meals, during or after intense exercise, and during sleep. Hypoglycemia symptoms can include:  ¨ Tremors or shakes.  ¨ Decreased ability to concentrate.  ¨ Sweating.  ¨ Increased heart rate.  ¨ Headache.  ¨ Dry mouth.  ¨ Hunger.  ¨ Irritability.  ¨ Anxiety.  ¨ Restless sleep.  ¨ Altered speech or coordination.  ¨ Confusion.  · Treat hypoglycemia promptly. If you are alert and able to safely swallow, follow the 15:15 rule:  ¨ Take 15-20 grams of rapid-acting glucose or carbohydrate. Rapid-acting options include glucose gel, glucose tablets, or 4 ounces (120 mL) of fruit juice, regular soda, or low-fat milk.  ¨ Check your blood glucose level 15 minutes after taking the glucose.  ¨ Take 15-20  grams more of glucose if the repeat blood glucose level is still 70 mg/dL or below.  ¨ Eat a meal or snack within 1 hour once blood glucose levels return to normal.  · Be alert to polyuria (excess urination) and polydipsia (excess thirst) which are early signs of hyperglycemia. An early awareness of hyperglycemia allows for prompt treatment. Treat hyperglycemia as directed by your health care provider.  · Engage in at least 30 minutes of physical activity a day or as directed by your health care provider. Ten minutes of physical activity timed 30 minutes after each meal is encouraged to control postprandial blood glucose levels.  · Adjust your insulin dosing and food intake as needed if you start a new exercise or sport.  · Follow your sick-day plan at any time you are unable to eat or drink as usual.  · Avoid tobacco and alcohol use.  · Keep all follow-up visits as directed   by your health care provider.  · Follow the advice of your health care provider regarding your prenatal and post-delivery (postpartum) appointments, meal planning, exercise, medicines, vitamins, blood tests, other medical tests, and physical activities.  · Perform daily skin and foot care. Examine your skin and feet daily for cuts, bruises, redness, nail problems, bleeding, blisters, or sores.  · Brush your teeth and gums at least twice a day and floss at least once a day. Follow up with your dentist regularly.  · Schedule an eye exam during the first trimester of your pregnancy or as directed by your health care provider.  · Share your diabetes management plan with your workplace or school.  · Stay up-to-date with immunizations.  · Learn to manage stress.  · Obtain ongoing diabetes education and support as needed.  · Learn about and consider breastfeeding your baby.  · You should have your blood sugar level checked 6-12 weeks after delivery. This is done with an oral glucose tolerance test (OGTT).  SEEK MEDICAL CARE IF:   · You are unable to  eat food or drink fluids for more than 6 hours.  · You have nausea and vomiting for more than 6 hours.  · You have a blood glucose level of 200 mg/dL and you have ketones in your urine.  · There is a change in mental status.  · You develop vision problems.  · You have a persistent headache.  · You have upper abdominal pain or discomfort.  · You develop an additional serious illness.  · You have diarrhea for more than 6 hours.  · You have been sick or have had a fever for a couple of days and are not getting better.  SEEK IMMEDIATE MEDICAL CARE IF:   · You have difficulty breathing.  · You no longer feel the baby moving.  · You are bleeding or have discharge from your vagina.  · You start having premature contractions or labor.  MAKE SURE YOU:  · Understand these instructions.  · Will watch your condition.  · Will get help right away if you are not doing well or get worse.     This information is not intended to replace advice given to you by your health care provider. Make sure you discuss any questions you have with your health care provider.     Document Released: 03/19/2001 Document Revised: 01/01/2015 Document Reviewed: 07/09/2012  Elsevier Interactive Patient Education ©2016 [employer]

## 2016-03-31 ENCOUNTER — Ambulatory Visit (INDEPENDENT_AMBULATORY_CARE_PROVIDER_SITE_OTHER): Payer: 59 | Admitting: Advanced Practice Midwife

## 2016-03-31 VITALS — BP 99/71 | HR 83 | Wt 135.0 lb

## 2016-03-31 DIAGNOSIS — O34219 Maternal care for unspecified type scar from previous cesarean delivery: Secondary | ICD-10-CM

## 2016-03-31 DIAGNOSIS — O09893 Supervision of other high risk pregnancies, third trimester: Secondary | ICD-10-CM

## 2016-03-31 DIAGNOSIS — O24419 Gestational diabetes mellitus in pregnancy, unspecified control: Secondary | ICD-10-CM

## 2016-03-31 NOTE — Progress Notes (Signed)
Fasting CBGs: 73--62 (0% out of range) PCB:  73-94 (0% out of range) PCL:  76-107 (0% out of range) PCD: 108-137 (30% out of range)  Subjective:  Paige Gamble is a 30 y.o. G2P1001 at 3865w6d being seen today for ongoing prenatal care.  She is currently monitored for the following issues for this high-risk pregnancy and has History of urinary anomaly; H/O anxiety state; Error, refractive, myopia; Cyanocobalamine deficiency (non anemic); History of cesarean section; Supervision of other high risk pregnancy, antepartum; Previous cesarean delivery affecting pregnancy, antepartum; Increased nuchal translucency space on fetal ultrasound; Gestational diabetes; Gestational diabetes mellitus (GDM) affecting pregnancy; and Maternal anemia in pregnancy, antepartum on her problem list.  Patient reports occasional contractions.  Contractions: Irritability. Vag. Bleeding: None.  Movement: Present. Denies leaking of fluid.   The following portions of the patient's history were reviewed and updated as appropriate: allergies, current medications, past family history, past medical history, past social history, past surgical history and problem list. Problem list updated.  Objective:   Filed Vitals:   03/31/16 0811  BP: 99/71  Pulse: 83  Weight: 135 lb (61.236 kg)    Fetal Status: Fetal Heart Rate (bpm): 132 Fundal Height: 38 cm Movement: Present  Presentation: Vertex  General:  Alert, oriented and cooperative. Patient is in no acute distress.  Skin: Skin is warm and dry. No rash noted.   Cardiovascular: Normal heart rate noted  Respiratory: Normal respiratory effort, no problems with respiration noted  Abdomen: Soft, gravid, appropriate for gestational age. Pain/Pressure: Present     Pelvic: Vag. Bleeding: None Vag D/C Character: Thin   Cervical exam performed Dilation: Fingertip Effacement (%): 0 Station: -3  Extremities: Normal range of motion.  Edema: Trace  Mental Status: Normal mood and affect.  Normal behavior. Normal judgment and thought content.   Urinalysis: Urine Protein: Negative Urine Glucose: Negative  Assessment and Plan:  Pregnancy: G2P1001 at 5265w6d  1. Supervision of other high risk pregnancy, antepartum, third trimester   2. Previous cesarean delivery affecting pregnancy, antepartum   3. Gestational diabetes mellitus (GDM) affecting pregnancy   Term labor symptoms and general obstetric precautions including but not limited to vaginal bleeding, contractions, leaking of fluid and fetal movement were reviewed in detail with the patient. Please refer to After Visit Summary for other counseling recommendations.  Return in about 1 week (around 04/07/2016).   Dorathy KinsmanVirginia Triston Lisanti, CNM

## 2016-03-31 NOTE — Patient Instructions (Signed)
Labor Induction Labor induction is when steps are taken to cause a pregnant woman to begin the labor process. Most women go into labor on their own between 37 weeks and 42 weeks of the pregnancy. When this does not happen or when there is a medical need, methods may be used to induce labor. Labor induction causes a pregnant woman's uterus to contract. It also causes the cervix to soften (ripen), open (dilate), and thin out (efface). Usually, labor is not induced before 39 weeks of the pregnancy unless there is a problem with the baby or mother.  Before inducing labor, your health care provider will consider a number of factors, including the following:  The medical condition of you and the baby.   How many weeks along you are.   The status of the baby's lung maturity.   The condition of the cervix.   The position of the baby.  WHAT ARE THE REASONS FOR LABOR INDUCTION? Labor may be induced for the following reasons:  The health of the baby or mother is at risk.   The pregnancy is overdue by 1 week or more.   The water breaks but labor does not start on its own.   The mother has a health condition or serious illness, such as high blood pressure, infection, placental abruption, or diabetes.  The amniotic fluid amounts are low around the baby.   The baby is distressed.  Convenience or wanting the baby to be born on a certain date is not a reason for inducing labor. WHAT METHODS ARE USED FOR LABOR INDUCTION? Several methods of labor induction may be used, such as:   Prostaglandin medicine. This medicine causes the cervix to dilate and ripen. The medicine will also start contractions. It can be taken by mouth or by inserting a suppository into the vagina.   Inserting a thin tube (catheter) with a balloon on the end into the vagina to dilate the cervix. Once inserted, the balloon is expanded with water, which causes the cervix to open.   Stripping the membranes. Your health  care provider separates amniotic sac tissue from the cervix, causing the cervix to be stretched and causing the release of a hormone called progesterone. This may cause the uterus to contract. It is often done during an office visit. You will be sent home to wait for the contractions to begin. You will then come in for an induction.   Breaking the water. Your health care provider makes a hole in the amniotic sac using a small instrument. Once the amniotic sac breaks, contractions should begin. This may still take hours to see an effect.   Medicine to trigger or strengthen contractions. This medicine is given through an IV access tube inserted into a vein in your arm.  All of the methods of induction, besides stripping the membranes, will be done in the hospital. Induction is done in the hospital so that you and the baby can be carefully monitored.  HOW LONG DOES IT TAKE FOR LABOR TO BE INDUCED? Some inductions can take up to 2-3 days. Depending on the cervix, it usually takes less time. It takes longer when you are induced early in the pregnancy or if this is your first pregnancy. If a mother is still pregnant and the induction has been going on for 2-3 days, either the mother will be sent home or a cesarean delivery will be needed. WHAT ARE THE RISKS ASSOCIATED WITH LABOR INDUCTION? Some of the risks of induction include:     Changes in fetal heart rate, such as too high, too low, or erratic.   Fetal distress.   Chance of infection for the mother and baby.   Increased chance of having a cesarean delivery.   Breaking off (abruption) of the placenta from the uterus (rare).   Uterine rupture (very rare).  When induction is needed for medical reasons, the benefits of induction may outweigh the risks. WHAT ARE SOME REASONS FOR NOT INDUCING LABOR? Labor induction should not be done if:   It is shown that your baby does not tolerate labor.   You have had previous surgeries on your  uterus, such as a myomectomy or the removal of fibroids.   Your placenta lies very low in the uterus and blocks the opening of the cervix (placenta previa).   Your baby is not in a head-down position.   The umbilical cord drops down into the birth canal in front of the baby. This could cut off the baby's blood and oxygen supply.   You have had a previous cesarean delivery.   There are unusual circumstances, such as the baby being extremely premature.    This information is not intended to replace advice given to you by your health care provider. Make sure you discuss any questions you have with your health care provider.   Document Released: 05/02/2007 Document Revised: 01/01/2015 Document Reviewed: 07/10/2013 Elsevier Interactive Patient Education 2016 [employer] Inc[employer]  

## 2016-04-10 ENCOUNTER — Ambulatory Visit (INDEPENDENT_AMBULATORY_CARE_PROVIDER_SITE_OTHER): Payer: 59 | Admitting: Advanced Practice Midwife

## 2016-04-10 ENCOUNTER — Telehealth (HOSPITAL_COMMUNITY): Payer: Self-pay | Admitting: *Deleted

## 2016-04-10 ENCOUNTER — Encounter: Payer: Self-pay | Admitting: Advanced Practice Midwife

## 2016-04-10 VITALS — BP 108/70 | HR 90 | Wt 134.0 lb

## 2016-04-10 DIAGNOSIS — O09893 Supervision of other high risk pregnancies, third trimester: Secondary | ICD-10-CM

## 2016-04-10 DIAGNOSIS — O24419 Gestational diabetes mellitus in pregnancy, unspecified control: Secondary | ICD-10-CM

## 2016-04-10 NOTE — Telephone Encounter (Signed)
Preadmission screen  

## 2016-04-10 NOTE — Progress Notes (Signed)
Subjective:  Paige Gamble is a 30 y.o. G2P1001 at 5863w2d being seen today for ongoing prenatal care.  She is currently monitored for the following issues for this high-risk pregnancy and has History of urinary anomaly; H/O anxiety state; Error, refractive, myopia; Cyanocobalamine deficiency (non anemic); History of cesarean section; Supervision of other high risk pregnancy, antepartum; Previous cesarean delivery affecting pregnancy, antepartum; Increased nuchal translucency space on fetal ultrasound; Gestational diabetes; Gestational diabetes mellitus (GDM) affecting pregnancy; and Maternal anemia in pregnancy, antepartum on her problem list.  Patient reports no complaints.  Contractions: Irregular. Vag. Bleeding: None.  Movement: Present. Denies leaking of fluid.  All CBGs normal (<90 FBS, <120 2Hr PC)  The following portions of the patient's history were reviewed and updated as appropriate: allergies, current medications, past family history, past medical history, past social history, past surgical history and problem list. Problem list updated.  Objective:   Filed Vitals:   04/10/16 0938  BP: 108/70  Pulse: 90  Weight: 60.782 kg (134 lb)    Fetal Status: Fetal Heart Rate (bpm): 142   Movement: Present     General:  Alert, oriented and cooperative. Patient is in no acute distress.  Skin: Skin is warm and dry. No rash noted.   Cardiovascular: Normal heart rate noted  Respiratory: Normal respiratory effort, no problems with respiration noted  Abdomen: Soft, gravid, appropriate for gestational age. Pain/Pressure: Present     Pelvic: Vag. Bleeding: None     Cervical exam performed        Cervix more effaced!  Extremities: Normal range of motion.  Edema: Trace  Mental Status: Normal mood and affect. Normal behavior. Normal judgment and thought content.   Urinalysis: Urine Protein: Negative Urine Glucose: Negative  Assessment and Plan:  Pregnancy: G2P1001 at 5463w2d  1. Supervision of  other high risk pregnancy, antepartum, third trimester      Reviewed NST late this week then IOL Saturday at 0700       Plan Foley and Pitocin       Got to 8cm first time  Term labor symptoms and general obstetric precautions including but not limited to vaginal bleeding, contractions, leaking of fluid and fetal movement were reviewed in detail with the patient. Please refer to After Visit Summary for other counseling recommendations.  Return in about 6 weeks (around 05/22/2016[place] [facility].   Aviva SignsMarie L Ellinore Merced, CNM

## 2016-04-10 NOTE — Patient Instructions (Signed)
Vaginal Delivery °During delivery, your health care provider will help you give birth to your baby. During a vaginal delivery, you will work to push the baby out of your vagina. However, before you can push your baby out, a few things need to happen. The opening of your uterus (cervix) has to soften, thin out, and open up (dilate) all the way to 10 cm. Also, your baby has to move down from the uterus into your vagina.  °SIGNS OF LABOR  °Your health care provider will first need to make sure you are in labor. Signs of labor include:  °· Passing what is called the mucous plug before labor begins. This is a small amount of blood-stained mucus. °· Having regular, painful uterine contractions.   °· The time between contractions gets shorter.   °· The discomfort and pain gradually get more intense. °· Contraction pains get worse when walking and do not go away when resting.   °· Your cervix becomes thinner (effacement) and dilates. °BEFORE THE DELIVERY °Once you are in labor and admitted into the hospital or care center, your health care provider may do the following:  °· Perform a complete physical exam. °· Review any complications related to pregnancy or labor.  °· Check your blood pressure, pulse, temperature, and heart rate (vital signs).   °· Determine if, and when, the rupture of amniotic membranes occurred. °· Do a vaginal exam (using a sterile glove and lubricant) to determine:   °¨ The position (presentation) of the baby. Is the baby's head presenting first (vertex) in the birth canal (vagina), or are the feet or buttocks first (breech)?   °¨ The level (station) of the baby's head within the birth canal.   °¨ The effacement and dilatation of the cervix.   °· An electronic fetal monitor is usually placed on your abdomen when you first arrive. This is used to monitor your contractions and the baby's heart rate. °¨ When the monitor is on your abdomen (external fetal monitor), it can only pick up the frequency and  length of your contractions. It cannot tell the strength of your contractions. °¨ If it becomes necessary for your health care provider to know exactly how strong your contractions are or to see exactly what the baby's heart rate is doing, an internal monitor may be inserted into your vagina and uterus. Your health care provider will discuss the benefits and risks of using an internal monitor and obtain your permission before inserting the device. °¨ Continuous fetal monitoring may be needed if you have an epidural, are receiving certain medicines (such as oxytocin), or have pregnancy or labor complications. °· An IV access tube may be placed into a vein in your arm to deliver fluids and medicines if necessary. °THREE STAGES OF LABOR AND DELIVERY °Normal labor and delivery is divided into three stages. °First Stage °This stage starts when you begin to contract regularly and your cervix begins to efface and dilate. It ends when your cervix is completely open (fully dilated). The first stage is the longest stage of labor and can last from 3 hours to 15 hours.  °Several methods are available to help with labor pain. You and your health care provider will decide which option is best for you. Options include:  °· Opioid medicines. These are strong pain medicines that you can get through your IV tube or as a shot into your muscle. These medicines lessen pain but do not make it go away completely.  °· Epidural. A medicine is given through a thin tube that   is inserted in your back. The medicine numbs the lower part of your body and prevents any pain in that area. °· Paracervical pain medicine. This is an injection of an anesthetic on each side of your cervix.   °· You may request natural childbirth, which does not involve the use of pain medicines or an epidural during labor and delivery. Instead, you will use other things, such as breathing exercises, to help cope with the pain. °Second Stage °The second stage of labor  begins when your cervix is fully dilated at 10 cm. It continues until you push your baby down through the birth canal and the baby is born. This stage can take only minutes or several hours. °· The location of your baby's head as it moves through the birth canal is reported as a number called a station. If the baby's head has not started its descent, the station is described as being at minus 3 (-3). When your baby's head is at the zero station, it is at the middle of the birth canal and is engaged in the pelvis. The station of your baby helps indicate the progress of the second stage of labor. °· When your baby is born, your health care provider may hold the baby with his or her head lowered to prevent amniotic fluid, mucus, and blood from getting into the baby's lungs. The baby's mouth and nose may be suctioned with a small bulb syringe to remove any additional fluid. °· Your health care provider may then place the baby on your stomach. It is important to keep the baby from getting cold. To do this, the health care provider will dry the baby off, place the baby directly on your skin (with no blankets between you and the baby), and cover the baby with warm, dry blankets.   °· The umbilical cord is cut. °Third Stage °During the third stage of labor, your health care provider will deliver the placenta (afterbirth) and make sure your bleeding is under control. The delivery of the placenta usually takes about 5 minutes but can take up to 30 minutes. After the placenta is delivered, a medicine may be given either by IV or injection to help contract the uterus and control bleeding. If you are planning to breastfeed, you can try to do so now. °After you deliver the placenta, your uterus should contract and get very firm. If your uterus does not remain firm, your health care provider will massage it. This is important because the contraction of the uterus helps cut off bleeding at the site where the placenta was attached  to your uterus. If your uterus does not contract properly and stay firm, you may continue to bleed heavily. If there is a lot of bleeding, medicines may be given to contract the uterus and stop the bleeding.  °  °This information is not intended to replace advice given to you by your health care provider. Make sure you discuss any questions you have with your health care provider. °  °Document Released: 09/19/2008 Document Revised: 01/01/2015 Document Reviewed: 08/07/2012 °Elsevier Interactive Patient Education ©2016 Elsevier Inc. ° °

## 2016-04-13 ENCOUNTER — Ambulatory Visit (INDEPENDENT_AMBULATORY_CARE_PROVIDER_SITE_OTHER): Payer: 59 | Admitting: *Deleted

## 2016-04-13 VITALS — BP 107/69 | HR 93 | Wt 137.0 lb

## 2016-04-13 DIAGNOSIS — O24419 Gestational diabetes mellitus in pregnancy, unspecified control: Secondary | ICD-10-CM

## 2016-04-14 ENCOUNTER — Other Ambulatory Visit: Payer: Self-pay | Admitting: Advanced Practice Midwife

## 2016-04-15 ENCOUNTER — Inpatient Hospital Stay (HOSPITAL_COMMUNITY): Payer: 59 | Admitting: Anesthesiology

## 2016-04-15 ENCOUNTER — Encounter (HOSPITAL_COMMUNITY): Payer: Self-pay

## 2016-04-15 ENCOUNTER — Inpatient Hospital Stay (HOSPITAL_COMMUNITY)
Admission: RE | Admit: 2016-04-15 | Discharge: 2016-04-18 | DRG: 765 | Disposition: A | Discharge status: Home / Self Care | Payer: 59 | Source: Ambulatory Visit | Attending: Obstetrics & Gynecology | Admitting: Obstetrics & Gynecology

## 2016-04-15 VITALS — BP 113/78 | HR 83 | Temp 97.8°F | Resp 18 | Ht 59.0 in | Wt 137.0 lb

## 2016-04-15 DIAGNOSIS — O99344 Other mental disorders complicating childbirth: Secondary | ICD-10-CM | POA: Diagnosis present

## 2016-04-15 DIAGNOSIS — D649 Anemia, unspecified: Secondary | ICD-10-CM | POA: Diagnosis present

## 2016-04-15 DIAGNOSIS — Z8249 Family history of ischemic heart disease and other diseases of the circulatory system: Secondary | ICD-10-CM | POA: Diagnosis not present

## 2016-04-15 DIAGNOSIS — F419 Anxiety disorder, unspecified: Secondary | ICD-10-CM | POA: Diagnosis present

## 2016-04-15 DIAGNOSIS — Z3A40 40 weeks gestation of pregnancy: Secondary | ICD-10-CM | POA: Diagnosis not present

## 2016-04-15 DIAGNOSIS — E538 Deficiency of other specified B group vitamins: Secondary | ICD-10-CM | POA: Diagnosis present

## 2016-04-15 DIAGNOSIS — Z87448 Personal history of other diseases of urinary system: Secondary | ICD-10-CM | POA: Diagnosis not present

## 2016-04-15 DIAGNOSIS — O34211 Maternal care for low transverse scar from previous cesarean delivery: Secondary | ICD-10-CM | POA: Diagnosis present

## 2016-04-15 DIAGNOSIS — Z833 Family history of diabetes mellitus: Secondary | ICD-10-CM | POA: Diagnosis not present

## 2016-04-15 DIAGNOSIS — O283 Abnormal ultrasonic finding on antenatal screening of mother: Secondary | ICD-10-CM

## 2016-04-15 DIAGNOSIS — O9902 Anemia complicating childbirth: Secondary | ICD-10-CM | POA: Diagnosis present

## 2016-04-15 DIAGNOSIS — O902 Hematoma of obstetric wound: Secondary | ICD-10-CM | POA: Diagnosis not present

## 2016-04-15 DIAGNOSIS — O24420 Gestational diabetes mellitus in childbirth, diet controlled: Principal | ICD-10-CM | POA: Diagnosis present

## 2016-04-15 DIAGNOSIS — O24419 Gestational diabetes mellitus in pregnancy, unspecified control: Secondary | ICD-10-CM

## 2016-04-15 DIAGNOSIS — O34219 Maternal care for unspecified type scar from previous cesarean delivery: Secondary | ICD-10-CM

## 2016-04-15 DIAGNOSIS — Z98891 History of uterine scar from previous surgery: Secondary | ICD-10-CM

## 2016-04-15 DIAGNOSIS — O43813 Placental infarction, third trimester: Secondary | ICD-10-CM | POA: Diagnosis not present

## 2016-04-15 HISTORY — DX: Anxiety disorder, unspecified: F41.9

## 2016-04-15 HISTORY — DX: Type 2 diabetes mellitus without complications: E11.9

## 2016-04-15 LAB — CBC
HCT: 37.2 % (ref 36.0–46.0)
Hemoglobin: 12.7 g/dL (ref 12.0–15.0)
MCH: 25 pg — ABNORMAL LOW (ref 26.0–34.0)
MCHC: 34.1 g/dL (ref 30.0–36.0)
MCV: 73.4 fL — ABNORMAL LOW (ref 78.0–100.0)
Platelets: 194 K/uL (ref 150–400)
RBC: 5.07 MIL/uL (ref 3.87–5.11)
RDW: 19.1 % — ABNORMAL HIGH (ref 11.5–15.5)
WBC: 8.5 K/uL (ref 4.0–10.5)

## 2016-04-15 LAB — GLUCOSE, CAPILLARY
GLUCOSE-CAPILLARY: 95 mg/dL (ref 65–99)
Glucose-Capillary: 105 mg/dL — ABNORMAL HIGH (ref 65–99)

## 2016-04-15 LAB — ABO/RH: ABO/RH(D): O POS

## 2016-04-15 LAB — SYPHILIS: RPR W/REFLEX TO RPR TITER AND TREPONEMAL ANTIBODIES, TRADITIONAL SCREENING AND DIAGNOSIS ALGORITHM: RPR Ser Ql: NONREACTIVE

## 2016-04-15 MED ORDER — DIPHENHYDRAMINE HCL 50 MG/ML IJ SOLN: 12.5000 mg | INTRAMUSCULAR | Status: DC | PRN

## 2016-04-15 MED ORDER — OXYCODONE-ACETAMINOPHEN 5-325 MG PO TABS: 1.0000 | ORAL_TABLET | ORAL | Status: DC | PRN

## 2016-04-15 MED ORDER — OXYTOCIN BOLUS FROM INFUSION: 500.0000 mL | INTRAVENOUS | Status: DC

## 2016-04-15 MED ORDER — LACTATED RINGERS IV SOLN
INTRAVENOUS | Status: DC
  Administered 2016-04-15 – 2016-04-16 (×3): via INTRAVENOUS

## 2016-04-15 MED ORDER — ACETAMINOPHEN 325 MG PO TABS
650.0000 mg | ORAL_TABLET | ORAL | Status: DC | PRN
  Administered 2016-04-15 – 2016-04-16 (×2): 650 mg via ORAL
  Filled 2016-04-15 (×2): qty 2

## 2016-04-15 MED ORDER — LIDOCAINE HCL (PF) 1 % IJ SOLN
INTRAMUSCULAR | Status: DC | PRN
  Administered 2016-04-15 (×2): 4 mL

## 2016-04-15 MED ORDER — LIDOCAINE HCL (PF) 1 % IJ SOLN
30.0000 mL | INTRAMUSCULAR | Status: DC | PRN
  Filled 2016-04-15: qty 30

## 2016-04-15 MED ORDER — TERBUTALINE SULFATE 1 MG/ML IJ SOLN: 0.2500 mg | Freq: Once | INTRAMUSCULAR | Status: DC | PRN

## 2016-04-15 MED ORDER — FENTANYL 2.5 MCG/ML BUPIVACAINE 1/10 % EPIDURAL INFUSION (WH - ANES)
14.0000 mL/h | INTRAMUSCULAR | Status: DC | PRN
  Administered 2016-04-15: 11 mL/h via EPIDURAL
  Administered 2016-04-15 – 2016-04-16 (×2): 14 mL/h via EPIDURAL
  Filled 2016-04-15 (×2): qty 125

## 2016-04-15 MED ORDER — EPHEDRINE 5 MG/ML INJ: 10.0000 mg | INTRAVENOUS | Status: DC | PRN

## 2016-04-15 MED ORDER — FENTANYL CITRATE (PF) 100 MCG/2ML IJ SOLN: 100.0000 ug | INTRAMUSCULAR | Status: DC | PRN

## 2016-04-15 MED ORDER — PHENYLEPHRINE 40 MCG/ML (10ML) SYRINGE FOR IV PUSH (FOR BLOOD PRESSURE SUPPORT)
80.0000 ug | PREFILLED_SYRINGE | INTRAVENOUS | Status: DC | PRN
  Filled 2016-04-15: qty 20

## 2016-04-15 MED ORDER — LACTATED RINGERS IV SOLN
500.0000 mL | Freq: Once | INTRAVENOUS | Status: AC
  Administered 2016-04-15: 500 mL via INTRAVENOUS

## 2016-04-15 MED ORDER — OXYTOCIN 10 UNIT/ML IJ SOLN
2.5000 [IU]/h | INTRAVENOUS | Status: DC
  Filled 2016-04-15: qty 4

## 2016-04-15 MED ORDER — OXYCODONE-ACETAMINOPHEN 5-325 MG PO TABS: 2.0000 | ORAL_TABLET | ORAL | Status: DC | PRN

## 2016-04-15 MED ORDER — LACTATED RINGERS IV SOLN
500.0000 mL | INTRAVENOUS | Status: DC | PRN
Start: 2016-04-15 — End: 2016-04-16

## 2016-04-15 MED ORDER — ONDANSETRON HCL 4 MG/2ML IJ SOLN: 4.0000 mg | Freq: Four times a day (QID) | INTRAMUSCULAR | Status: DC | PRN

## 2016-04-15 MED ORDER — PHENYLEPHRINE 40 MCG/ML (10ML) SYRINGE FOR IV PUSH (FOR BLOOD PRESSURE SUPPORT): 80.0000 ug | PREFILLED_SYRINGE | INTRAVENOUS | Status: DC | PRN

## 2016-04-15 MED ORDER — LACTATED RINGERS IV SOLN
1.0000 m[IU]/min | INTRAVENOUS | Status: DC
  Administered 2016-04-15: 2 m[IU]/min via INTRAVENOUS
  Administered 2016-04-15: 12 m[IU]/min via INTRAVENOUS
  Administered 2016-04-16: 6 m[IU]/min via INTRAVENOUS
  Administered 2016-04-16: 4 m[IU]/min via INTRAVENOUS

## 2016-04-15 MED ORDER — CITRIC ACID-SODIUM CITRATE 334-500 MG/5ML PO SOLN
30.0000 mL | ORAL | Status: DC | PRN
  Administered 2016-04-16: 30 mL via ORAL
  Filled 2016-04-15: qty 15

## 2016-04-15 NOTE — Progress Notes (Signed)
Argie Ramminghyllis Woodlief is a 30 y.o. G2P1001 at 7363w0d by LMP c/w early US admitted for induction of labor due to Gestational diabetes, diet controlled.  Subjective: Pt comfortable, reports some irregular mild contractions.  Husband in room for support.  Objective: BP 117/76 mmHg  Pulse 89  Temp(Src) 98.2 F (36.8 C) (Oral)  Resp 18  Ht 4\' 11"  (1.499 m)  Wt 137 lb (62.143 kg)  BMI 27.66 kg/m2  LMP 07/10/2015 (Exact Date)      FHT:  FHR: 135 bpm, variability: moderate,  accelerations:  Present,  decelerations:  Absent UC:   irregular, every 20 minutes SVE:   1/60/-2, mid-position, soft, vertex  Foley bulb placed without difficulty by CNM, filled to 60 ml by RN.  Pt tolerated well.  Labs: Lab Results  Component Value Date   WBC 8.5 04/15/2016   HGB 12.7 04/15/2016   HCT 37.2 04/15/2016   MCV 73.4* 04/15/2016   PLT 194 04/15/2016    Assessment / Plan: Induction of labor due to gestational diabetes FB in place  Labor: Progressing normally Preeclampsia:  n/a Fetal Wellbeing:  Category I Pain Control:  Labor support without medications I/D:  n/a Anticipated MOD:  NSVD  LEFTWICH-KIRBY, Buck Mcaffee 04/15/2016, 9:24 AM

## 2016-04-15 NOTE — Progress Notes (Signed)
Argie Ramminghyllis Pagliarulo is a 30 y.o. G2P1001 at 2778w0d admitted for induction of labor due to Gestational diabetes.  Subjective: Pt comfortable, reports occasional mild contractions.  Family in room for support.  Objective: BP 129/68 mmHg  Pulse 90  Temp(Src) 98.3 F (36.8 C) (Oral)  Resp 18  Ht 4\' 11"  (1.499 m)  Wt 137 lb (62.143 kg)  BMI 27.66 kg/m2  LMP 07/10/2015 (Exact Date)      FHT:  FHR: 135 bpm, variability: moderate,  accelerations:  Present,  decelerations:  Absent UC:   occasional SVE:   Dilation: 5 Effacement (%): 50 Station: -3 Exam by:: Misty StanleyLisa, CNM   RN notified CNM that FB came out at 1430  Labs: Lab Results  Component Value Date   WBC 8.5 04/15/2016   HGB 12.7 04/15/2016   HCT 37.2 04/15/2016   MCV 73.4* 04/15/2016   PLT 194 04/15/2016    Assessment / Plan: Induction of labor due to gestational diabetes S/P Foley Bulb  Labor: Progressing normally Preeclampsia:  n/a Fetal Wellbeing:  Category I Pain Control:  Labor support without medications I/D:  n/a Anticipated MOD:  NSVD  LEFTWICH-KIRBY, Corinda Ammon 04/15/2016, 4:47 PM

## 2016-04-15 NOTE — Anesthesia Preprocedure Evaluation (Signed)
Anesthesia Evaluation  Patient identified by MRN, date of birth, ID band Patient awake    Reviewed: Allergy & Precautions, NPO status , Patient's Chart, lab work & pertinent test results  History of Anesthesia Complications Negative for: history of anesthetic complications  Airway Mallampati: II  TM Distance: >3 FB Neck ROM: Full    Dental no notable dental hx. (+) Dental Advisory Given   Pulmonary neg pulmonary ROS,    Pulmonary exam normal breath sounds clear to auscultation       Cardiovascular negative cardio ROS Normal cardiovascular exam Rhythm:Regular Rate:Normal     Neuro/Psych negative neurological ROS  negative psych ROS   GI/Hepatic negative GI ROS, Neg liver ROS,   Endo/Other  diabetes  Renal/GU negative Renal ROS  negative genitourinary   Musculoskeletal negative musculoskeletal ROS (+)   Abdominal   Peds negative pediatric ROS (+)  Hematology negative hematology ROS (+)   Anesthesia Other Findings   Reproductive/Obstetrics (+) Pregnancy Hx of C/S                             Anesthesia Physical Anesthesia Plan  ASA: II  Anesthesia Plan: Epidural   Post-op Pain Management:    Induction:   Airway Management Planned:   Additional Equipment:   Intra-op Plan:   Post-operative Plan:   Informed Consent: I have reviewed the patients History and Physical, chart, labs and discussed the procedure including the risks, benefits and alternatives for the proposed anesthesia with the patient or authorized representative who has indicated his/her understanding and acceptance.   Dental advisory given  Plan Discussed with: CRNA  Anesthesia Plan Comments:         Anesthesia Quick Evaluation

## 2016-04-15 NOTE — H&P (Signed)
LABOR AND DELIVERY ADMISSION HISTORY AND PHYSICAL NOTE  Paige Gamble is a 30 y.o. female G2P1001 with IUP at [redacted]w[redacted]d by 7 week [place] presenting for IOL for A1DM (TOLAC).  She reports positive fetal movement. She denies leakage of fluid or vaginal bleeding.  She denies HA, blurred vision, RUQ/epigastric pain.  Prenatal History/Complications: A1DM, anemia, anxiety; NT 3.7 mm with NB present; NIPS normal per Pt report.   Clinic  [facility] Prenatal Labs  Dating  7 wk  Blood type: O/POS/-- (09/09 1013)   Genetic Screen 1 Screen:  Not done due to NT being 3.28mm  >> referred to genetic counseling; reports NIPS negative (pt) > obtain results Antibody:NEG (09/09 1013)  Anatomic [place]  Nml @ 20 wks, limited; fetal echo nml. DNWTK sex of baby Rubella: 2.76 (09/09 1013)  GTT Early:               Third trimester: 174     3 hr GTT- [phone] > GDM ed appt RPR: NON REAC (09/09 1013)   Flu vaccine  Obtained HBsAg: NEGATIVE (09/09 1013)   TDaP vaccine  01/21/16                           Rhogam:NA HIV: NONREACTIVE (09/09 1013)   Baby Food  Breast                                          GBS: Neg (For PCN allergy, check sensitivities)  Contraception Paragard Pap:Nml 06/2015  Circumcision IP if female   Pediatrician  Dr. Sung Amabile - [facility]   Support Person  [facility] Maisie Fus      Past Medical History: Past Medical History  Diagnosis Date  . Gestational diabetes mellitus (GDM), antepartum     Past Surgical History: Past Surgical History  Procedure Laterality Date  . Cesarean section    . Wisdom tooth extraction      Obstetrical History: OB History    Gravida Para Term Preterm AB TAB SAB Ectopic Multiple Living   Social History: Social History   Social History  . Marital Status: Married    Spouse Name: N/A  . Number of Children: N/A  . Years of Education: N/A   Occupational History  . Med tech    Social History Main Topics  . Smoking status: Never Smoker   .  Smokeless tobacco: Never Used  . Alcohol Use: 0.0 oz/week    0 Standard drinks or equivalent per week  . Drug Use: No  . Sexual Activity:    Partners: Male    Birth Control/ Protection: IUD   Other Topics Concern  . Not on file   Social History Narrative    Family History: Family History  Problem Relation Age of Onset  . Diabetes Mother   . Hypertension Father     Allergies: No Known Allergies   (Not in a hospital admission)   Review of Systems   All systems reviewed and negative except as stated in HPI  Last menstrual period 07/10/2015. General appearance: alert, cooperative and no distress Lungs: clear to auscultation bilaterally Heart: regular rate and rhythm Abdomen: soft, non-tender; bowel sounds normal Extremities: No calf swelling or tenderness Presentation: cephalic Fetal monitoring: 135bpm, moderate variability, accelerations present, decelerations absent Uterine activity:  Irregular, every 10 minutes    Prenatal labs: ABO, Rh: O/POS/-- (09/09 1013) Antibody: NEG (09/09 1013) Rubella: !Error! Immune RPR: NON REAC (01/30 0805)  HBsAg: NEGATIVE (09/09 1013)  HIV: NONREACTIVE (01/30 0805)  GBS: Negative (03/27 0000)  1 hr Glucola: [phone] (A1DM) Genetic screening:  Not done due to NT being 3.837mm, Pt referred to genetic counseling; NIPS negative per Pt report Anatomy US: Normal   Prenatal Transfer Tool  Maternal Diabetes: Yes:  Diabetes Type:  Diet controlled Genetic Screening: Normal Maternal Ultrasounds/Referrals: Normal Fetal Ultrasounds or other Referrals:  Fetal echo normal Maternal Substance Abuse:  No Significant Maternal Medications:  None Significant Maternal Lab Results: None  No results found for this or any previous visit (from the past 24 hour(s)).  Patient Active Problem List   Diagnosis Date Noted  . Maternal anemia in pregnancy, antepartum 02/18/2016  . Gestational diabetes 01/31/2016  . Gestational diabetes mellitus  (GDM) affecting pregnancy 01/31/2016  . Increased nuchal translucency space on fetal ultrasound 01/10/2016  . Supervision of other high risk pregnancy, antepartum 09/03/2015  . Previous cesarean delivery affecting pregnancy, antepartum 09/03/2015  . History of cesarean section 07/22/2015  . Error, refractive, myopia 09/17/2013  . 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 H/O anxiety state 12/13/2011    Assessment: Paige Gamble is a 30 y.o. G2P1001 at 6164w0d here for IOL for A1DM  #Labor: IOL for A1DM, TOLAC, will start with Foley #Pain: Does not want epidural at this time, but is open to having one depending on how her labor goes #FWB: Category I #ID: GBS negative #MOF: Breast #MOC: Paraguard #Circ: They do not know if they are having a boy or girl, but no circ if boy  Hilton SinclairKaty D Mayo 04/15/2016, 5:56 AM  I have seen this patient and agree with the above resident's note.  LEFTWICH-KIRBY, Isobel Eisenhuth Certified Nurse-Midwife

## 2016-04-15 NOTE — Anesthesia Procedure Notes (Signed)
Epidural Patient location during procedure: OB  Staffing Anesthesiologist: Kati Riggenbach Performed by: anesthesiologist   Preanesthetic Checklist Completed: patient identified, site marked, surgical consent, pre-op evaluation, timeout performed, IV checked, risks and benefits discussed and monitors and equipment checked  Epidural Patient position: sitting Prep: site prepped and draped and DuraPrep Patient monitoring: continuous pulse ox and blood pressure Approach: midline Location: L3-L4 Injection technique: LOR saline  Needle:  Needle type: Tuohy  Needle gauge: 17 G Needle length: 9 cm and 9 Needle insertion depth: 5 cm cm Catheter type: closed end flexible Catheter size: 19 Gauge Catheter at skin depth: 10 cm Test dose: negative  Assessment Events: blood not aspirated, injection not painful, no injection resistance, negative IV test and no paresthesia  Additional Notes Patient identified. Risks/Benefits/Options discussed with patient including but not limited to bleeding, infection, nerve damage, paralysis, failed block, incomplete pain control, headache, blood pressure changes, nausea, vomiting, reactions to medication both or allergic, itching and postpartum back pain. Confirmed with bedside nurse the patient's most recent platelet count. Confirmed with patient that they are not currently taking any anticoagulation, have any bleeding history or any family history of bleeding disorders. Patient expressed understanding and wished to proceed. All questions were answered. Sterile technique was used throughout the entire procedure. Please see nursing notes for vital signs. Test dose was given through epidural catheter and negative prior to continuing to dose epidural or start infusion. Warning signs of high block given to the patient including shortness of breath, tingling/numbness in hands, complete motor block, or any concerning symptoms with instructions to call for help. Patient was  given instructions on fall risk and not to get out of bed. All questions and concerns addressed with instructions to call with any issues or inadequate analgesia.      

## 2016-04-16 ENCOUNTER — Encounter (HOSPITAL_COMMUNITY)
Admission: RE | Disposition: A | Discharge status: Home / Self Care | Payer: Self-pay | Source: Ambulatory Visit | Attending: Obstetrics & Gynecology

## 2016-04-16 ENCOUNTER — Encounter (HOSPITAL_COMMUNITY): Payer: Self-pay

## 2016-04-16 DIAGNOSIS — O902 Hematoma of obstetric wound: Secondary | ICD-10-CM | POA: Diagnosis not present

## 2016-04-16 DIAGNOSIS — O24420 Gestational diabetes mellitus in childbirth, diet controlled: Secondary | ICD-10-CM

## 2016-04-16 DIAGNOSIS — Z3A40 40 weeks gestation of pregnancy: Secondary | ICD-10-CM

## 2016-04-16 LAB — CBC
HCT: 28.9 % — ABNORMAL LOW (ref 36.0–46.0)
HEMATOCRIT: 30.5 % — AB (ref 36.0–46.0)
HEMOGLOBIN: 10.5 g/dL — AB (ref 12.0–15.0)
Hemoglobin: 9.7 g/dL — ABNORMAL LOW (ref 12.0–15.0)
MCH: 24.9 pg — ABNORMAL LOW (ref 26.0–34.0)
MCH: 26.2 pg (ref 26.0–34.0)
MCHC: 33.6 g/dL (ref 30.0–36.0)
MCHC: 34.4 g/dL (ref 30.0–36.0)
MCV: 74.1 fL — ABNORMAL LOW (ref 78.0–100.0)
MCV: 76.1 fL — AB (ref 78.0–100.0)
PLATELETS: 166 10*3/uL (ref 150–400)
Platelets: 139 10*3/uL — ABNORMAL LOW (ref 150–400)
RBC: 3.9 MIL/uL (ref 3.87–5.11)
RBC: 4.01 MIL/uL (ref 3.87–5.11)
RDW: 19.4 % — AB (ref 11.5–15.5)
RDW: 18.5 % — AB (ref 11.5–15.5)
WBC: 21.4 10*3/uL — AB (ref 4.0–10.5)
WBC: 20 10*3/uL — ABNORMAL HIGH (ref 4.0–10.5)

## 2016-04-16 LAB — GLUCOSE, CAPILLARY: GLUCOSE-CAPILLARY: 73 mg/dL (ref 65–99)

## 2016-04-16 LAB — PREPARE RBC (CROSSMATCH)

## 2016-04-16 SURGERY — Surgical Case
Anesthesia: Epidural

## 2016-04-16 MED ORDER — CEFAZOLIN SODIUM-DEXTROSE 2-3 GM-% IV SOLR
INTRAVENOUS | Status: DC | PRN
  Administered 2016-04-16: 2 g via INTRAVENOUS

## 2016-04-16 MED ORDER — STERILE WATER FOR IRRIGATION IR SOLN
Status: DC | PRN
  Administered 2016-04-16: 1

## 2016-04-16 MED ORDER — ONDANSETRON HCL 4 MG/2ML IJ SOLN
INTRAMUSCULAR | Status: DC | PRN
  Administered 2016-04-16: 4 mg via INTRAVENOUS

## 2016-04-16 MED ORDER — MEPERIDINE HCL 25 MG/ML IJ SOLN
INTRAMUSCULAR | Status: DC | PRN
  Administered 2016-04-16 (×2): 12.5 mg via INTRAVENOUS

## 2016-04-16 MED ORDER — METHYLENE BLUE 0.5 % INJ SOLN
INTRAVENOUS | Status: DC | PRN
  Administered 2016-04-16: 10 mL via SUBMUCOSAL

## 2016-04-16 MED ORDER — LACTATED RINGERS IV SOLN: INTRAVENOUS | Status: DC

## 2016-04-16 MED ORDER — OXYTOCIN 10 UNIT/ML IJ SOLN
INTRAMUSCULAR | Status: AC
Start: 2016-04-16 — End: 2016-04-16
  Filled 2016-04-16: qty 4

## 2016-04-16 MED ORDER — OXYTOCIN 10 UNIT/ML IJ SOLN
40.0000 [IU] | INTRAVENOUS | Status: DC | PRN
  Administered 2016-04-16: 40 [IU] via INTRAVENOUS

## 2016-04-16 MED ORDER — ACETAMINOPHEN 325 MG PO TABS: 650.0000 mg | ORAL_TABLET | ORAL | Status: DC | PRN

## 2016-04-16 MED ORDER — LACTATED RINGERS IV SOLN
INTRAVENOUS | Status: DC
  Administered 2016-04-16 – 2016-04-17 (×2): via INTRAVENOUS

## 2016-04-16 MED ORDER — ONDANSETRON HCL 4 MG/2ML IJ SOLN
INTRAMUSCULAR | Status: AC
  Filled 2016-04-16: qty 2

## 2016-04-16 MED ORDER — OXYCODONE-ACETAMINOPHEN 5-325 MG PO TABS: 2.0000 | ORAL_TABLET | ORAL | Status: DC | PRN

## 2016-04-16 MED ORDER — MEPERIDINE HCL 25 MG/ML IJ SOLN
INTRAMUSCULAR | Status: AC
  Filled 2016-04-16: qty 1

## 2016-04-16 MED ORDER — ZOLPIDEM TARTRATE 5 MG PO TABS: 5.0000 mg | ORAL_TABLET | Freq: Every evening | ORAL | Status: DC | PRN

## 2016-04-16 MED ORDER — DIPHENHYDRAMINE HCL 25 MG PO CAPS
25.0000 mg | ORAL_CAPSULE | Freq: Once | ORAL | Status: DC
  Filled 2016-04-16: qty 1

## 2016-04-16 MED ORDER — ACETAMINOPHEN 500 MG PO TABS
ORAL_TABLET | ORAL | Status: AC
  Filled 2016-04-16: qty 2

## 2016-04-16 MED ORDER — FENTANYL CITRATE (PF) 100 MCG/2ML IJ SOLN
25.0000 ug | INTRAMUSCULAR | Status: DC | PRN
Start: 2016-04-16 — End: 2016-04-16

## 2016-04-16 MED ORDER — MAGNESIUM HYDROXIDE 400 MG/5ML PO SUSP
30.0000 mL | ORAL | Status: DC | PRN
  Filled 2016-04-16: qty 30

## 2016-04-16 MED ORDER — ACETAMINOPHEN 325 MG PO TABS: 650.0000 mg | ORAL_TABLET | Freq: Once | ORAL | Status: DC

## 2016-04-16 MED ORDER — MENTHOL 3 MG MT LOZG
1.0000 | LOZENGE | OROMUCOSAL | Status: DC | PRN
  Filled 2016-04-16: qty 9

## 2016-04-16 MED ORDER — MORPHINE SULFATE (PF) 0.5 MG/ML IJ SOLN
INTRAMUSCULAR | Status: DC | PRN
  Administered 2016-04-16: 4 mg via EPIDURAL

## 2016-04-16 MED ORDER — LACTATED RINGERS IV SOLN
INTRAVENOUS | Status: DC | PRN
  Administered 2016-04-16: 11:00:00 via INTRAVENOUS

## 2016-04-16 MED ORDER — LACTATED RINGERS IV SOLN
INTRAVENOUS | Status: DC | PRN
  Administered 2016-04-16 (×3): via INTRAVENOUS

## 2016-04-16 MED ORDER — TETANUS-DIPHTH-ACELL PERTUSSIS 5-2.5-18.5 LF-MCG/0.5 IM SUSP: 0.5000 mL | Freq: Once | INTRAMUSCULAR | Status: DC

## 2016-04-16 MED ORDER — FENTANYL CITRATE (PF) 100 MCG/2ML IJ SOLN
INTRAMUSCULAR | Status: AC
  Filled 2016-04-16: qty 2

## 2016-04-16 MED ORDER — BUPIVACAINE HCL (PF) 0.5 % IJ SOLN
INTRAMUSCULAR | Status: AC
  Filled 2016-04-16: qty 30

## 2016-04-16 MED ORDER — MORPHINE SULFATE (PF) 0.5 MG/ML IJ SOLN
INTRAMUSCULAR | Status: AC
  Filled 2016-04-16: qty 10

## 2016-04-16 MED ORDER — COCONUT OIL OIL
1.0000 "application " | TOPICAL_OIL | Status: DC | PRN
  Filled 2016-04-16: qty 120

## 2016-04-16 MED ORDER — SIMETHICONE 80 MG PO CHEW
80.0000 mg | CHEWABLE_TABLET | ORAL | Status: DC
Start: 2016-04-17 — End: 2016-04-18
  Administered 2016-04-17: 80 mg via ORAL
  Filled 2016-04-16 (×4): qty 1

## 2016-04-16 MED ORDER — SODIUM CHLORIDE 0.9 % IV SOLN: Freq: Once | INTRAVENOUS | Status: DC

## 2016-04-16 MED ORDER — SIMETHICONE 80 MG PO CHEW
80.0000 mg | CHEWABLE_TABLET | ORAL | Status: DC | PRN
  Administered 2016-04-17 – 2016-04-18 (×2): 80 mg via ORAL
  Filled 2016-04-16 (×2): qty 1

## 2016-04-16 MED ORDER — PHENYLEPHRINE HCL 10 MG/ML IJ SOLN
INTRAMUSCULAR | Status: DC | PRN
  Administered 2016-04-16 (×6): 80 ug via INTRAVENOUS

## 2016-04-16 MED ORDER — PHENYLEPHRINE 8 MG IN D5W 100 ML (0.08MG/ML) PREMIX OPTIME
INJECTION | INTRAVENOUS | Status: DC | PRN
  Administered 2016-04-16: 60 ug/min via INTRAVENOUS

## 2016-04-16 MED ORDER — PRENATAL MULTIVITAMIN CH
1.0000 | ORAL_TABLET | Freq: Every day | ORAL | Status: DC
  Administered 2016-04-17 – 2016-04-18 (×2): 1 via ORAL
  Filled 2016-04-16 (×4): qty 1

## 2016-04-16 MED ORDER — ACETAMINOPHEN 500 MG PO TABS
1000.0000 mg | ORAL_TABLET | Freq: Once | ORAL | Status: AC
  Administered 2016-04-16: 1000 mg via ORAL

## 2016-04-16 MED ORDER — FENTANYL CITRATE (PF) 100 MCG/2ML IJ SOLN
INTRAMUSCULAR | Status: DC | PRN
  Administered 2016-04-16: 25 ug via INTRAVENOUS

## 2016-04-16 MED ORDER — OXYCODONE-ACETAMINOPHEN 5-325 MG PO TABS
1.0000 | ORAL_TABLET | ORAL | Status: DC | PRN
  Administered 2016-04-17 – 2016-04-18 (×5): 1 via ORAL
  Filled 2016-04-16 (×5): qty 1

## 2016-04-16 MED ORDER — SENNOSIDES-DOCUSATE SODIUM 8.6-50 MG PO TABS
2.0000 | ORAL_TABLET | ORAL | Status: DC
  Administered 2016-04-17 (×2): 2 via ORAL
  Filled 2016-04-16 (×4): qty 2

## 2016-04-16 MED ORDER — IBUPROFEN 600 MG PO TABS
600.0000 mg | ORAL_TABLET | Freq: Four times a day (QID) | ORAL | Status: DC
  Administered 2016-04-17 – 2016-04-18 (×7): 600 mg via ORAL
  Filled 2016-04-16 (×7): qty 1

## 2016-04-16 MED ORDER — PHENYLEPHRINE 8 MG IN D5W 100 ML (0.08MG/ML) PREMIX OPTIME
INJECTION | INTRAVENOUS | Status: AC
  Filled 2016-04-16: qty 100

## 2016-04-16 MED ORDER — FERROUS SULFATE 325 (65 FE) MG PO TABS
325.0000 mg | ORAL_TABLET | Freq: Two times a day (BID) | ORAL | Status: DC
  Administered 2016-04-17 – 2016-04-18 (×3): 325 mg via ORAL
  Filled 2016-04-16 (×6): qty 1

## 2016-04-16 MED ORDER — WITCH HAZEL-GLYCERIN EX PADS: 1.0000 "application " | MEDICATED_PAD | CUTANEOUS | Status: DC | PRN

## 2016-04-16 MED ORDER — SODIUM BICARBONATE 8.4 % IV SOLN
INTRAVENOUS | Status: DC | PRN
  Administered 2016-04-16 (×2): 5 mL via EPIDURAL

## 2016-04-16 MED ORDER — OXYTOCIN 10 UNIT/ML IJ SOLN
2.5000 [IU]/h | INTRAMUSCULAR | Status: AC
  Filled 2016-04-16: qty 4

## 2016-04-16 MED ORDER — DIPHENHYDRAMINE HCL 25 MG PO CAPS
25.0000 mg | ORAL_CAPSULE | Freq: Four times a day (QID) | ORAL | Status: DC | PRN
  Filled 2016-04-16: qty 1

## 2016-04-16 MED ORDER — MEASLES, MUMPS & RUBELLA VAC ~~LOC~~ INJ
0.5000 mL | INJECTION | Freq: Once | SUBCUTANEOUS | Status: DC
  Filled 2016-04-16: qty 0.5

## 2016-04-16 MED ORDER — SODIUM CHLORIDE 0.9 % IV SOLN
Freq: Once | INTRAVENOUS | Status: AC
  Administered 2016-04-16: 13:00:00 via INTRAVENOUS

## 2016-04-16 MED ORDER — METHYLENE BLUE 0.5 % INJ SOLN: 10.0000 mL | Freq: Once | INTRAVENOUS | Status: DC

## 2016-04-16 MED ORDER — BUPIVACAINE HCL (PF) 0.5 % IJ SOLN
INTRAMUSCULAR | Status: DC | PRN
  Administered 2016-04-16: 30 mL

## 2016-04-16 MED ORDER — PHENYLEPHRINE 40 MCG/ML (10ML) SYRINGE FOR IV PUSH (FOR BLOOD PRESSURE SUPPORT)
PREFILLED_SYRINGE | INTRAVENOUS | Status: AC
  Filled 2016-04-16: qty 10

## 2016-04-16 MED ORDER — PHENYLEPHRINE 40 MCG/ML (10ML) SYRINGE FOR IV PUSH (FOR BLOOD PRESSURE SUPPORT)
PREFILLED_SYRINGE | INTRAVENOUS | Status: AC
  Filled 2016-04-16: qty 20

## 2016-04-16 MED ORDER — DIBUCAINE 1 % RE OINT
1.0000 "application " | TOPICAL_OINTMENT | RECTAL | Status: DC | PRN
  Filled 2016-04-16: qty 56.7

## 2016-04-16 SURGICAL SUPPLY — 30 items
CHLORAPREP W/TINT 26ML (MISCELLANEOUS) ×2 IMPLANT
CLAMP CORD UMBIL (MISCELLANEOUS) IMPLANT
CLOTH BEACON ORANGE TIMEOUT ST (SAFETY) ×2 IMPLANT
DRSG OPSITE POSTOP 4X10 (GAUZE/BANDAGES/DRESSINGS) ×2 IMPLANT
ELECT REM PT RETURN 9FT ADLT (ELECTROSURGICAL) ×2
ELECTRODE REM PT RTRN 9FT ADLT (ELECTROSURGICAL) ×1 IMPLANT
EXTRACTOR VACUUM M CUP 4 TUBE (SUCTIONS) IMPLANT
GLOVE BIOGEL PI IND STRL 7.0 (GLOVE) ×3 IMPLANT
GLOVE BIOGEL PI INDICATOR 7.0 (GLOVE) ×3
GLOVE ECLIPSE 7.0 STRL STRAW (GLOVE) ×6 IMPLANT
GOWN STRL REUS W/TWL LRG LVL3 (GOWN DISPOSABLE) ×4 IMPLANT
HEMOSTAT SURGICEL 4X8 (HEMOSTASIS) ×2 IMPLANT
KIT ABG SYR 3ML LUER SLIP (SYRINGE) IMPLANT
NEEDLE HYPO 22GX1.5 SAFETY (NEEDLE) ×2 IMPLANT
NEEDLE HYPO 25X5/8 SAFETYGLIDE (NEEDLE) ×2 IMPLANT
NS IRRIG 1000ML POUR BTL (IV SOLUTION) ×2 IMPLANT
PACK C SECTION WH (CUSTOM PROCEDURE TRAY) ×2 IMPLANT
PAD ABD 7.5X8 STRL (GAUZE/BANDAGES/DRESSINGS) ×2 IMPLANT
PAD OB MATERNITY 4.3X12.25 (PERSONAL CARE ITEMS) ×2 IMPLANT
PENCIL SMOKE EVAC W/HOLSTER (ELECTROSURGICAL) ×2 IMPLANT
RTRCTR C-SECT PINK 25CM LRG (MISCELLANEOUS) IMPLANT
SPONGE GAUZE 4X4 12PLY STER LF (GAUZE/BANDAGES/DRESSINGS) ×4 IMPLANT
SUT PDS AB 0 CTX 36 PDP370T (SUTURE) ×2 IMPLANT
SUT PLAIN 2 0 XLH (SUTURE) IMPLANT
SUT VIC AB 0 CTX 36 (SUTURE) ×4
SUT VIC AB 0 CTX36XBRD ANBCTRL (SUTURE) ×4 IMPLANT
SUT VIC AB 4-0 KS 27 (SUTURE) ×2 IMPLANT
SYR CONTROL 10ML LL (SYRINGE) ×2 IMPLANT
TOWEL OR 17X24 6PK STRL BLUE (TOWEL DISPOSABLE) ×2 IMPLANT
TRAY FOLEY CATH SILVER 14FR (SET/KITS/TRAYS/PACK) ×2 IMPLANT

## 2016-04-16 NOTE — Op Note (Signed)
Chellie Vanlue PROCEDURE DATE: 04/16/2016  PREOPERATIVE DIAGNOSES: Intrauterine pregnancy at [redacted]w[redacted]d weeks gestation; previous cesarean section; failure to progress: arrest of dilation  POSTOPERATIVE DIAGNOSES: The same  PROCEDURE: Repeat Low Transverse Cesarean Section  SURGEON:  Dr. Jaynie Collins  INDICATIONS: Mayvis Agudelo is a 30 y.o. [id number] at [redacted]w[redacted]d here for cesarean section secondary to the indications listed under preoperative diagnoses; please see preoperative note for further details.  The risks of cesarean section were discussed with the patient including but were not limited to: bleeding which may require transfusion or reoperation; infection which may require antibiotics; injury to bowel, bladder, ureters or other surrounding organs; injury to the fetus; need for additional procedures including hysterectomy in the event of a life-threatening hemorrhage; placental abnormalities wth subsequent pregnancies, incisional problems, thromboembolic phenomenon and other postoperative/anesthesia complications.   The patient concurred with the proposed plan, giving informed written consent for the procedure.    FINDINGS:  Viable female infant in cephalic presentation.  Apgars 8 and 9.  Meconium stained amniotic fluid.  Intact placenta, three vessel cord.  Bladder was very adherent to the anterior abdominal wall and high up on the lower uterine segment (LUS); back fill of the bladder was done and there was no cystotomy was noted. The LUS was noted to be significantly thin; and there was a significant extension of the hysterotomy to the left uterine vessels which resulted in significant blood loss in a short amount of time. The vessels were ligated with 0 Vicryl stitches; the extension was then closed in the usual fashion.  There was also some bleeding from bladder flap area; bladder needed to be dissected off the lower uterine segment in order to make the low transverse hysterotomy.  Surgicel was placed in  this dissected area.  Otherwise, normal uterus, fallopian tubes and ovaries bilaterally.   ANESTHESIA: Epidural INTRAVENOUS FLUIDS: 2300 ml ESTIMATED BLOOD LOSS: 1500 ml URINE OUTPUT:  350 ml SPECIMENS: Placenta sent to pathology COMPLICATIONS: Increased bleeding secondary to extension of the hysterotomy to left uterine vessels.  PROCEDURE IN DETAIL:  The patient preoperatively received intravenous antibiotics and had sequential compression devices applied to her lower extremities.  She was then taken to the operating room where the epidural anesthesia was dosed up to surgical level and was found to be adequate. She was then placed in a dorsal supine position with a leftward tilt, and prepped and draped in a sterile manner.  A foley catheter was placed into her bladder and attached to constant gravity.  After an adequate timeout was performed, a Pfannenstiel skin incision was made with scalpel over her preexisting scar, and carried through to the underlying layer of fascia. The fascia was incised in the midline, and this incision was extended bilaterally using the Mayo scissors.  Kocher clamps were applied to the superior aspect of the fascial incision and the underlying rectus muscles were dissected off bluntly.  A similar process was carried out on the inferior aspect of the fascial incision. The rectus muscles were separated in the midline sharply and the peritoneum was entered sharply. The bladder was noted to be very high up on the LUS and also adherent to anterior abdominal wall.  Attention was turned to the lower uterine segment where a bladder flap was created in blunt and sharp methods; and the bladder was dissected off the LUS which was noted to be very thin.  A low transverse hysterotomy was made with a scalpel and extended bilaterally bluntly.  The infant was successfully delivered,  the cord was clamped and cut after one minute, and the infant was handed over to the awaiting neonatology team.  Uterine massage was then administered, and the placenta delivered intact with a three-vessel cord. The uterus was then cleared of clot and debris. Increased bleeding was noted and led to the discovery of injury to the left uterine vessels and the hysterotomy extension. Figure-of-eight 0 Vicryl stitches were used to ligate these vessels. The extension and the rest of the hysterotomy was closed with 0 Vicryl in a running locked fashion, and an imbricating layer was also placed with 0 Vicryl.  The pelvis was cleared of all clot and debris. Surgicel was placed on the bladder dissection area.  The bladder was backfilled with a solution of sterile water and methylene blue; no injury to bladder was noted.  Hemostasis was confirmed on all surfaces.  The peritoneum was reapproximated using 0 Vicryl running stitch. The fascia was then closed using 0 PDS in a running fashion.  The subcutaneous layer was irrigated, and 30 ml of 0.5% Marcaine was injected subcutaneously around the incision.  The skin was closed with a 4-0 Vicryl subcuticular stitch. The patient tolerated the procedure well. Sponge, lap, instrument and needle counts were correct x 3.  She was taken to the recovery room in stable condition.   Will check a CBC in PACU; patient is typed and crossmatched for 2 units of pBRCs. Will keep epidural catheter in place for now. Patient and her husband were informed of the concern about the bleeding and possible risk of continued bleeding needing reoperation or further procedures.  Will monitor in PACU closely.    Jaynie CollinsUGONNA  Dalayza Zambrana, MD, FACOG Attending Obstetrician & Gynecologist Faculty Practice, [facility] [place]

## 2016-04-16 NOTE — Progress Notes (Addendum)
First unit of PRBCs completed at 1505.  No transfusion reaction noted.  Pt is stable at this time.  Bettie Rutherford RN  Second unit actually started at 1600.

## 2016-04-16 NOTE — Anesthesia Postprocedure Evaluation (Signed)
Anesthesia Post Note  Patient: Paige Gamble  Procedure(s) Performed: Procedure(s) (LRB): CESAREAN SECTION (N/A)  Patient location during evaluation: PACU Anesthesia Type: Epidural Level of consciousness: awake and alert Pain management: pain level controlled Vital Signs Assessment: post-procedure vital signs reviewed and stable Respiratory status: spontaneous breathing, nonlabored ventilation, respiratory function stable and patient connected to nasal cannula oxygen Cardiovascular status: blood pressure returned to baseline and stable Postop Assessment: no signs of nausea or vomiting Anesthetic complications: no    Last Vitals:  Filed Vitals:   04/16/16 1300 04/16/16 1315  BP: 105/65 104/69  Pulse: 86 101  Temp:  37.8 C  Resp: 13 19    Last Pain:  Filed Vitals:   04/16/16 1344  PainSc: 0-No pain                 [name] L

## 2016-04-16 NOTE — Progress Notes (Signed)
LABOR PROGRESS NOTE  Paige Gamble is a 30 y.o. G2P1001 at 874w1d admitted for IOL for A1GDM, TOLAC attempt.  Patient had a prolonged FHR deceleration for about 5 minutes and pitocin was turned off. Not adequate contractions, no cervical change.  Subjective: Comfortable with epidural   Objective: BP 102/55 mmHg  Pulse 91  Temp(Src) 98.8 F (37.1 C) (Oral)  Resp 20  Ht 4\' 11"  (1.499 m)  Wt 137 lb (62.143 kg)  BMI 27.66 kg/m2  SpO2 100%  LMP 07/10/2015 (Exact Date) or  Filed Vitals:   04/16/16 0800 04/16/16 0830 04/16/16 0900 04/16/16 0930  BP: 116/74 114/70 133/90 102/55  Pulse: 91 83 101 91  Temp:   98.8 F (37.1 C)   TempSrc:   Oral   Resp: 18 20 20 20   Height:      Weight:      SpO2:        135/moderate variability/+accels/prolonged deceleration has resolved, now with repetitive variable decels  Dilation: 7 Effacement (%): 80 Cervical Position: Middle Station: -2 Presentation: Vertex Exam by:: Hart RochesterLawson, CNM No cervical change since 0300 Inadequate contractions Pitocin was up to 14 mu/min when deceleration occurred, now off.  Labs: Lab Results  Component Value Date   WBC 8.5 04/15/2016   HGB 12.7 04/15/2016   HCT 37.2 04/15/2016   MCV 73.4* 04/15/2016   PLT 194 04/15/2016  Stable CBGs.   Assessment / Plan: 30 y.o. G2P1001 at 4474w1d here for IOL for A1GDM, TOLAC attempt. Now with arrest of cervical dilation since 0300 with inadequate contractions, concerning FHR tracing which occurs with increasing pitocin levels.    Repeat cesarean section recommended. The risks of cesarean section discussed with the patient included but were not limited to: bleeding which may require transfusion or reoperation; infection which may require antibiotics; injury to bowel, bladder, ureters or other surrounding organs; injury to the fetus; need for additional procedures including hysterectomy in the event of a life-threatening hemorrhage; placental abnormalities wth subsequent  pregnancies, incisional problems, thromboembolic phenomenon and other postoperative/anesthesia complications. The patient concurred with the proposed plan, giving informed written consent for the procedure.   Anesthesia and OR aware. Preoperative prophylactic antibiotics and SCDs ordered on call to the OR.  To OR when ready.   Tereso NewcomerANYANWU,Terrace Chiem A, MD 04/16/2016, 10:11 AM

## 2016-04-16 NOTE — Addendum Note (Signed)
Addendum  created 04/16/16 1545 by Renford DillsJanet L Mattix Imhof, CRNA   Modules edited: Clinical Notes   Clinical Notes:  File: [id number]

## 2016-04-16 NOTE — Consult Note (Signed)
Delivery Note   Requested by Dr. Macon LargeAnyanwu to attend this repeat C-section delivery at [redacted] weeks GA due to failed VBAC .   Born [facility] a G2P1001, GBS negative [facility]  Pregnancy complicated by diet controlled GDM.   Intrapartum course complicated by failed VBAC. ROM occurred at 0017 on 04/16/16 with meconium stained fluid.   Infant vigorous with good spontaneous cry.  Delayed cord clamping.  Routine NRP followed including warming, drying and stimulation.  Apgars 8 / 9.  Physical exam within normal limits with the exception of a right cephalohematoma .   Left in OR for skin-to-skin contact with mother, in care of CN staff.  Care transferred to Pediatrician.  Rocco SereneJennifer Titan Karner, NNP-BC

## 2016-04-16 NOTE — Progress Notes (Signed)
MOB was referred for history of depression/anxiety. * Referral screened out by Clinical Social Worker because none of the following criteria appear to apply: ~ History of anxiety/depression during this pregnancy, or of post-partum depression. ~ Diagnosis of anxiety and/or depression within last 3 years OR * MOB's symptoms currently being treated with medication and/or therapy. Please contact the Clinical Social Worker if needs arise, or if MOB requests.  Diagnosis of Anxiety noted in 2012.

## 2016-04-16 NOTE — Progress Notes (Signed)
Patient is still hypotensive, BP 90s/60s in PACU and tachycardia. No signs of ongoing bleeding.  Labs pending for now but will proceed with rapid transfusion of 2 units of pRBCs. Will continue to monitor closely.  Jaynie CollinsUGONNA  Reis Pienta, MD, FACOG Attending Obstetrician & Gynecologist, [facility] Faculty Practice, [facility]

## 2016-04-16 NOTE — Anesthesia Postprocedure Evaluation (Signed)
Anesthesia Post Note  Patient: Paige Gamble  Procedure(s) Performed: Procedure(s) (LRB): CESAREAN SECTION (N/A)  Patient location during evaluation: Mother Baby Anesthesia Type: Epidural Level of consciousness: awake Pain management: pain level controlled Vital Signs Assessment: post-procedure vital signs reviewed and stable Respiratory status: spontaneous breathing Cardiovascular status: stable Postop Assessment: no headache, no backache, epidural receding, patient able to bend at knees, no signs of nausea or vomiting and adequate PO intake Anesthetic complications: no    Last Vitals:  Filed Vitals:   04/16/16 1430 04/16/16 1534  BP: 114/71 116/78  Pulse: 82 83  Temp: 37.1 C 37.2 C  Resp: 18 19    Last Pain:  Filed Vitals:   04/16/16 1535  PainSc: 1                  Carolyn Maniscalco

## 2016-04-16 NOTE — Progress Notes (Signed)
LABOR PROGRESS NOTE  Paige Gamble is a 30 y.o. G2P1001 at 2563w1d  admitted for IOL for A1DM (TOLAC)  Subjective: Pt doing well. She is comfortable after her epidural.  Objective: BP 108/62 mmHg  Pulse 90  Temp(Src) 98.4 F (36.9 C) (Oral)  Resp 18  Ht 4\' 11"  (1.499 m)  Wt 137 lb (62.143 kg)  BMI 27.66 kg/m2  SpO2 100%  LMP 07/10/2015 (Exact Date) or  Filed Vitals:   04/16/16 0400 04/16/16 0430 04/16/16 0458 04/16/16 0500  BP: 108/55 117/72  108/62  Pulse: 84 91  90  Temp:   98.4 F (36.9 C)   TempSrc:   Oral   Resp:      Height:      Weight:      SpO2:        Dilation: 8 Effacement (%): 80 Cervical Position: Middle Station: -2 Presentation: Vertex Exam by:: JDaley  Labs: Lab Results  Component Value Date   WBC 8.5 04/15/2016   HGB 12.7 04/15/2016   HCT 37.2 04/15/2016   MCV 73.4* 04/15/2016   PLT 194 04/15/2016    Patient Active Problem List   Diagnosis Date Noted  . Gestational diabetes mellitus (GDM) in third trimester 04/15/2016  . Maternal anemia in pregnancy, antepartum 02/18/2016  . Gestational diabetes 01/31/2016  . Gestational diabetes mellitus (GDM) affecting pregnancy 01/31/2016  . Increased nuchal translucency space on fetal ultrasound 01/10/2016  . Supervision of other high risk pregnancy, antepartum 09/03/2015  . Previous cesarean delivery affecting pregnancy, antepartum 09/03/2015  . History of cesarean section 07/22/2015  . Error, refractive, myopia 09/17/2013  . 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 H/O anxiety state 12/13/2011    Assessment / Plan: 30 y.o. G2P1001 at 1863w1d here for IOL for A1DM. TOLAC.  Labor: Progressing on Pit. Pit was decreased due to contractions occurring too close together. Pit was further decreased due to some late decels. IUPC placed at 0500. Will try to titrate Pit back up. Fetal Wellbeing:  Category I Pain Control:  Well-controlled with  epidural Anticipated MOD:  TOLAC. Anticipate SVD.  Paige SinclairKaty D Brayson Livesey, MD 04/16/2016, 5:26 AM

## 2016-04-16 NOTE — Progress Notes (Signed)
LABOR PROGRESS NOTE  Paige Gamble is a 30 y.o. G2P1001 at 8659w1d  admitted for iol for a1gdm  Subjective: Mild/mod painful contractions  Objective: BP 109/55 mmHg  Pulse 83  Temp(Src) 98.4 F (36.9 C) (Oral)  Resp 18  Ht 4\' 11"  (1.499 m)  Wt 137 lb (62.143 kg)  BMI 27.66 kg/m2  SpO2 100%  LMP 07/10/2015 (Exact Date) or  Filed Vitals:   04/16/16 0530 04/16/16 0600 04/16/16 0630 04/16/16 0700  BP: 103/56 98/56 108/59 109/55  Pulse: 86 84 84 83  Temp:  98.4 F (36.9 C)    TempSrc:  Oral    Resp:      Height:      Weight:      SpO2:        150/mod/+a/-d  Dilation: 7.5 Effacement (%): 80 Cervical Position: Middle Station: -2 Presentation: Vertex Exam by:: DrMayo  Labs: Lab Results  Component Value Date   WBC 8.5 04/15/2016   HGB 12.7 04/15/2016   HCT 37.2 04/15/2016   MCV 73.4* 04/15/2016   PLT 194 04/15/2016    Patient Active Problem List   Diagnosis Date Noted  . Gestational diabetes mellitus (GDM) in third trimester 04/15/2016  . Maternal anemia in pregnancy, antepartum 02/18/2016  . Gestational diabetes 01/31/2016  . Gestational diabetes mellitus (GDM) affecting pregnancy 01/31/2016  . Increased nuchal translucency space on fetal ultrasound 01/10/2016  . Supervision of other high risk pregnancy, antepartum 09/03/2015  . Previous cesarean delivery affecting pregnancy, antepartum 09/03/2015  . History of cesarean section 07/22/2015  . Error, refractive, myopia 09/17/2013  . 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 H/O anxiety state 12/13/2011    Assessment / Plan: 30 y.o. G2P1001 at 6159w1d here for iol for a1gdm  Labor: pitocin. Up-titrating to 200 mvus (iupc placed). At 5 cervical exam 7.5. ROM has occurred. If no cervical change by 11 with inadequate, or sooner with 4 hours of adequate ctxns, will likely need c/s Fetal Wellbeing:  Cat 1 Pain Control:  epidural Anticipated MOD:  Hopeful for  vaginal delivery  Silvano BilisNoah B Jemell Town, MD 04/16/2016, 7:13 AM

## 2016-04-16 NOTE — Transfer of Care (Signed)
Immediate Anesthesia Transfer of Care Note  Patient: Paige Gamble  Procedure(s) Performed: Procedure(s): CESAREAN SECTION (N/A)  Patient Location: PACU  Anesthesia Type:Epidural  Level of Consciousness: awake and alert   Airway & Oxygen Therapy: Patient Spontanous Breathing  Post-op Assessment: Report given to RN and Post -op Vital signs reviewed and stable  Post vital signs: Reviewed  Last Vitals:  Filed Vitals:   04/16/16 1006 04/16/16 1010  BP: 112/63 120/78  Pulse: 90 109  Temp:    Resp: 20 20    Complications: No apparent anesthesia complications

## 2016-04-17 ENCOUNTER — Encounter (HOSPITAL_COMMUNITY): Payer: Self-pay | Admitting: Obstetrics & Gynecology

## 2016-04-17 LAB — TYPE AND SCREEN
ABO/RH(D): O POS
Antibody Screen: NEGATIVE
UNIT DIVISION: 0
Unit division: 0

## 2016-04-17 LAB — CBC
HEMATOCRIT: 27.6 % — AB (ref 36.0–46.0)
HEMOGLOBIN: 9.5 g/dL — AB (ref 12.0–15.0)
MCH: 26 pg (ref 26.0–34.0)
MCHC: 34.4 g/dL (ref 30.0–36.0)
MCV: 75.4 fL — AB (ref 78.0–100.0)
Platelets: 147 10*3/uL — ABNORMAL LOW (ref 150–400)
RBC: 3.66 MIL/uL — AB (ref 3.87–5.11)
RDW: 18.6 % — AB (ref 11.5–15.5)
WBC: 16.3 10*3/uL — AB (ref 4.0–10.5)

## 2016-04-17 NOTE — Lactation Note (Signed)
This note was copied from a baby's chart. Lactation Consultation Note  Patient Name: Paige Gamble HQION'GToday's Date: 04/17/2016 Reason for consult: Initial assessment   With this mom and term baby, now 4825 hours old, and has done some cluster feeding as per mom. Mom has full breasts, with easily expresable colostrum.  The baby was fussy while I was in the room, but he did not want to feed, left skin to skin with mom. Basic review of lactation services done with mom, as well as review of breast feeding teaching in the baby and me book. Mom is a [employer] employee, and dad is going to lactation store to get DEP for mom. Mom knows to call for questions/concerns. Mom and baby doing well at this time   Maternal Data Formula Feeding for Exclusion: No Has patient been taught Hand Expression?: Yes Does the patient have breastfeeding experience prior to this delivery?: Yes  Feeding Feeding Type: Breast Fed  LATCH Score/Interventions Latch: Grasps breast easily, tongue down, lips flanged, rhythmical sucking. Intervention(s): Adjust position;Assist with latch  Audible Swallowing:  (easily expresed colostrum, good flow)  Type of Nipple: Everted at rest and after stimulation  Comfort (Breast/Nipple): Soft / non-tender     Hold (Positioning): No assistance needed to correctly position infant at breast. Intervention(s): Breastfeeding basics reviewed;Support Pillows;Position options;Skin to skin  LATCH Score: 9  Lactation Tools Discussed/Used     Consult Status Consult Status: Follow-up Follow-up type: Call as needed    Paige Gamble, Paige Gamble 04/17/2016, 12:46 PM

## 2016-04-17 NOTE — Progress Notes (Signed)
Subjective: Postpartum Day 1: Cesarean Delivery Patient reports incisional pain and tolerating PO.    Objective: Vital signs in last 24 hours: Temp:  [98.2 F (36.8 C)-100.5 F (38.1 C)] 98.9 F (37.2 C) (04/24 0030) Pulse Rate:  [78-126] 82 (04/24 0030) Resp:  [13-26] 20 (04/24 0030) BP: (91-133)/(53-90) 117/76 mmHg (04/24 0030) SpO2:  [95 %-100 %] 96 % (04/24 0030)  Physical Exam:  General: alert, cooperative, appears stated age and no distress Lochia: appropriate Uterine Fundus: firm Incision: no significant drainage DVT Evaluation: No evidence of DVT seen on physical exam. Negative Homan's sign. No cords or calf tenderness.   Recent Labs  04/16/16 1218 04/16/16 1942  HGB 9.7* 10.5*  HCT 28.9* 30.5*    Assessment/Plan: Status post Cesarean section. Doing well postoperatively.  Continue current care.  Paige Gamble 04/17/2016, 5:55 AM

## 2016-04-18 LAB — GLUCOSE, CAPILLARY
GLUCOSE-CAPILLARY: 66 mg/dL (ref 65–99)
GLUCOSE-CAPILLARY: 90 mg/dL (ref 65–99)

## 2016-04-18 MED ORDER — OXYCODONE-ACETAMINOPHEN 5-325 MG PO TABS: 1.0000 | ORAL_TABLET | ORAL | Status: DC | PRN

## 2016-04-18 MED ORDER — IBUPROFEN 600 MG PO TABS: 600.0000 mg | ORAL_TABLET | Freq: Four times a day (QID) | ORAL | Status: DC | PRN

## 2016-04-18 MED ORDER — FERROUS SULFATE 325 (65 FE) MG PO TABS: 325.0000 mg | ORAL_TABLET | Freq: Every day | ORAL | Status: DC

## 2016-04-18 MED FILL — IBUPROFEN 600 MG TABLET: 600 | 13 days supply | Qty: 50 | Fill #0

## 2016-04-18 MED FILL — FERROUS SULFATE 325 MG TAB: 325 (65 FE) | 100 days supply | Qty: 100 | Fill #0

## 2016-04-18 MED FILL — OXYCODONE/APAP 5-325: 5-325 | 4 days supply | Qty: 20 | Fill #0

## 2016-04-18 NOTE — Discharge Summary (Signed)
OB Discharge Summary     Patient Name: Paige Gamble DOB: 02-13-1986 MRN: [id number]  Date of admission: 04/15/2016 Delivering MD: Jaynie CollinsANYANWU, UGONNA A   Date of discharge: 04/18/2016  Admitting diagnosis: INDUCTION 40WKS  Intrauterine pregnancy: 2525w1d     Secondary diagnosis:  Principal Problem:   S/P repeat cesarean section for arrest of dilation Active Problems:   Gestational diabetes mellitus (GDM) in third trimester   Postpartum hemorrhage during cesarean section   Additional problems: none     Discharge diagnosis: Term Pregnancy Delivered and GDM A1                                                                                                Post partum procedures:blood transfusion  Augmentation: Pitocin and Foley Balloon  Complications: Hemorrhage>101200mL  Hospital course:  Induction of Labor With Cesarean Section  30 y.o. yo G2P2002 at 3925w1d was admitted to the hospital 04/15/2016 for induction of labor for A1gDM. IOL was attempted with Foley balloon followed by pitocin.  She SROMed but then had arrest of dilation as well as nonreassuring FHTs, and it was decided to proceed with rLTCS. She delivered a Viable infant,@BABYSUPPRESS (DBLINK,ept,110,,1,,) Membrane Rupture Time/Date: )12:17 AM ,04/16/2016   Details of operation can be found in separate operative Note.  Her CS was complicated by injury to left uterine vessels with subsequent EBL of 1500cc.  Her Hgb went from 12.7 to 9.7, and as she was symptomatic, she was given 2u PRBCs and started on iron.  Her Hgb stabilized with last check 9.5.  She is ambulating, tolerating a regular diet, passing flatus, and urinating well.  On POD #2, her FPG was 90. Patient is discharged home in stable condition on 04/18/2016.                                     Physical exam  Filed Vitals:   04/17/16 0815 04/17/16 1225 04/17/16 1808 04/18/16 0617  BP: 102/66 107/61 126/73 113/78  Pulse: 83 72 96 83  Temp: 98.6 F (37 C) 98.5 F (36.9 C)  97.7 F (36.5 C) 97.8 F (36.6 C)  TempSrc: Oral Oral Oral Oral  Resp: 20 18 18 18   Height:      Weight:      SpO2: 94% 95%     General: alert, cooperative and no distress Lochia: appropriate Uterine Fundus: firm Incision: Healing well with no significant drainage DVT Evaluation: No evidence of DVT seen on physical exam. Labs: Lab Results  Component Value Date   WBC 16.3* 04/17/2016   HGB 9.5* 04/17/2016   HCT 27.6* 04/17/2016   MCV 75.4* 04/17/2016   PLT 147* 04/17/2016   No flowsheet data found.  Discharge instruction: per After Visit Summary and "Baby and Me Booklet".  After visit meds:    Medication List    STOP taking these medications        Doxylamine-Pyridoxine 10-10 MG Tbec     FUSION PLUS Caps     glucose blood test strip  Lancet Device Misc     nystatin-triamcinolone cream  Commonly known as:  MYCOLOG II     triamcinolone lotion 0.1 %  Commonly known as:  KENALOG      TAKE these medications        ferrous sulfate 325 (65 FE) MG tablet  Take 1 tablet (325 mg total) by mouth daily with breakfast.     ibuprofen 600 MG tablet  Commonly known as:  ADVIL,MOTRIN  Take 1 tablet (600 mg total) by mouth every 6 (six) hours as needed for mild pain or cramping.     multivitamin-prenatal 27-0.8 MG Tabs tablet  Take 1 tablet by mouth daily at 12 noon.     oxyCODONE-acetaminophen 5-325 MG tablet  Commonly known as:  PERCOCET/ROXICET  Take 1 tablet by mouth every 4 (four) hours as needed for moderate pain or severe pain (pain scale 4-7).     ranitidine 150 MG tablet  Commonly known as:  ZANTAC  Take 150 mg by mouth 2 (two) times daily.        Diet: routine diet  Activity: Advance as tolerated. Pelvic rest for 6 weeks.   Outpatient follow up:6 weeks  Postpartum contraception: IUD Paragard  Newborn Data: Live born female  Birth Weight: 7 lb 6.7 oz (3365 g) APGAR: 8, 9  Baby Feeding: Breast Disposition:home with  mother   04/18/2016 Caesar Chestnut, MD   OB fellow attestation I have seen and examined this patient and agree with above documentation in the resident's note.   Paige Gamble is a 30 y.o. [id number] s/p rLTCS after failed IOL.   Pain is well controlled.  Plan for birth control is IUD.  Method of Feeding: breast  PE:  BP 113/78 mmHg  Pulse 83  Temp(Src) 97.8 F (36.6 C) (Oral)  Resp 18  Ht  (1.499 m)  Wt 137 lb (62.143 kg)  BMI 27.66 kg/m2  SpO2 95%  LMP 07/10/2015 (Exact Date)  Breastfeeding? Unknown Gen: well appearing Heart: reg rate Lungs: normal WOB Fundus firm Ext: soft, no pain, no edema   Recent Labs  04/16/16 1942 04/17/16 0552  HGB 10.5* 9.5*  HCT 30.5* 27.6*   Plan: discharge today - postpartum care discussed - f/u clinic in 6 weeks for postpartum visit  Federico Flake, MD 9:02 AM  Attending physician: Candelaria Celeste MD

## 2016-04-18 NOTE — Discharge Instructions (Signed)
Cesarean Delivery, Care After  Refer to this sheet in the next few weeks. These instructions provide you with information on caring for yourself after your procedure. Your health care provider may also give you specific instructions. Your treatment has been planned according to current medical practices, but problems sometimes occur. Call your health care provider if you have any problems or questions after you go home.  HOME CARE INSTRUCTIONS   Only take over-the-counter or prescription medications as directed by your health care provider.   Do not drink alcohol, especially if you are breastfeeding or taking medication to relieve pain.   Do not chew or smoke tobacco.   Continue to use good perineal care. Good perineal care includes:    Wiping your perineum from front to back.    Keeping your perineum clean.   Check your surgical cut (incision) daily for increased redness, drainage, swelling, or separation of skin.   Clean your incision gently with soap and water every day, and then pat it dry. If your health care provider says it is okay, leave the incision uncovered. Use a bandage (dressing) if the incision is draining fluid or appears irritated. If the adhesive strips across the incision do not fall off within 7 days, carefully peel them off.   Hug a pillow when coughing or sneezing until your incision is healed. This helps to relieve pain.   Do not use tampons or douche until your health care provider says it is okay.   Shower, wash your hair, and take tub baths as directed by your health care provider.   Wear a well-fitting bra that provides breast support.   Limit wearing support panties or control-top hose.   Drink enough fluids to keep your urine clear or pale yellow.   Eat high-fiber foods such as whole grain cereals and breads, brown rice, beans, and fresh fruits and vegetables every day. These foods may help prevent or relieve constipation.   Resume activities such as climbing stairs,  driving, lifting, exercising, or traveling as directed by your health care provider.   Talk to your health care provider about resuming sexual activities. This is dependent upon your risk of infection, your rate of healing, and your comfort and desire to resume sexual activity.   Try to have someone help you with your household activities and your newborn for at least a few days after you leave the hospital.   Rest as much as possible. Try to rest or take a nap when your newborn is sleeping.   Increase your activities gradually.   Keep all of your scheduled postpartum appointments. It is very important to keep your scheduled follow-up appointments. At these appointments, your health care provider will be checking to make sure that you are healing physically and emotionally.  SEEK MEDICAL CARE IF:    You are passing large clots from your vagina. Save any clots to show your health care provider.   You have a foul smelling discharge from your vagina.   You have trouble urinating.   You are urinating frequently.   You have pain when you urinate.   You have a change in your bowel movements.   You have increasing redness, pain, or swelling near your incision.   You have pus draining from your incision.   Your incision is separating.   You have painful, hard, or reddened breasts.   You have a severe headache.   You have blurred vision or see spots.   You feel sad   or depressed.   You have thoughts of hurting yourself or your newborn.   You have questions about your care, the care of your newborn, or medications.   You are dizzy or light-headed.   You have a rash.   You have pain, redness, or swelling at the site of the removed intravenous access (IV) tube.   You have nausea or vomiting.   You stopped breastfeeding and have not had a menstrual period within 12 weeks of stopping.   You are not breastfeeding and have not had a menstrual period within 12 weeks of delivery.   You have a fever.  SEEK  IMMEDIATE MEDICAL CARE IF:   You have persistent pain.   You have chest pain.   You have shortness of breath.   You faint.   You have leg pain.   You have stomach pain.   Your vaginal bleeding saturates 2 or more sanitary pads in 1 hour.  MAKE SURE YOU:    Understand these instructions.   Will watch your condition.   Will get help right away if you are not doing well or get worse.     This information is not intended to replace advice given to you by your health care provider. Make sure you discuss any questions you have with your health care provider.     Document Released: 09/02/2002 Document Revised: 01/01/2015 Document Reviewed: 08/07/2012  Elsevier Interactive Patient Education 2016 [employer]

## 2016-04-18 NOTE — Lactation Note (Signed)
This note was copied from a baby's chart. Lactation Consultation Note  Patient Name: Boy Argie Ramminghyllis Heckman HYQMV'HToday's Date: 04/18/2016   Checked in with Mom on day of discharge.  Baby 48 hrs old and is feeding at the breast well. Denies needing any assistance from lactation currently.  Recommended baby have 8-12 feedings in 24 hrs hours.  To feed skin to skin, and on cue.  Engorgement prevention and treatment discussed.  Reminded Mom of OP Lactation services available to her.  Encouraged her to call prn.   Judee ClaraSmith, Gunhild Bautch E 04/18/2016, 11:20 AM

## 2016-06-01 ENCOUNTER — Ambulatory Visit (INDEPENDENT_AMBULATORY_CARE_PROVIDER_SITE_OTHER): Payer: 59 | Admitting: Obstetrics & Gynecology

## 2016-06-01 ENCOUNTER — Encounter: Payer: Self-pay | Admitting: Obstetrics & Gynecology

## 2016-06-01 MED ORDER — MEDROXYPROGESTERONE ACETATE 2.5 MG PO TABS: 2.5000 mg | ORAL_TABLET | Freq: Every day | ORAL | Status: DC

## 2016-06-01 MED ORDER — ESTRADIOL 0.1 MG/24HR TD PTTW: 1.0000 | MEDICATED_PATCH | TRANSDERMAL | Status: DC

## 2016-06-01 NOTE — Progress Notes (Signed)
  Subjective:     Paige Gamble is a 30 y.o. female who presents for a postpartum visit. She is 6 weeks postpartum following a low cervical transverse Cesarean section. I have fully reviewed the prenatal and intrapartum course. The delivery was at term gestational weeks. Outcome: repeat cesarean section, low transverse incision. Anesthesia: epidural. Postpartum course has been normal. Baby's course has been nomral. Baby is feeding by breast. Bleeding no bleeding. Bowel function is normal. Bladder function is normal. Patient is sexually active. Contraception method is withdrawal. Postpartum depression screening: negative.  The following portions of the patient's history were reviewed and updated as appropriate: allergies, current medications, past family history, past medical history, past social history, past surgical history and problem list.  Review of Systems Pertinent items are noted in HPI.   Objective:    BP 98/58 mmHg  Pulse 76  Resp 16  Ht 4\' 11"  (1.499 m)  Wt 114 lb (51.71 kg)  BMI 23.01 kg/m2  Breastfeeding? Yes  General:  alert   Breasts:  inspection negative, no nipple discharge or bleeding, no masses or nodularity palpable  Lungs: clear to auscultation bilaterally  Heart:  regular rate and rhythm, S1, S2 normal, no murmur, click, rub or gallop  Abdomen: soft, non-tender; bowel sounds normal; no masses,  no organomegaly   Incision: healed well   Vulva:  not evaluated  Vagina: not evaluated  Cervix:  not evaluated  Corpus: not examined  Adnexa:  not evaluated  Rectal Exam: Not performed.        Assessment:   Normalpostpartum exam. Pap smear not done at today's visit.   Plan:    1. Contraception: condoms 2. RTC for IUD (She is still undecided which type she wants- used Paragard in the past) 3. Follow up in: 2 weeks or as needed.

## 2016-06-20 ENCOUNTER — Ambulatory Visit (INDEPENDENT_AMBULATORY_CARE_PROVIDER_SITE_OTHER): Payer: 59 | Admitting: Obstetrics & Gynecology

## 2016-06-20 ENCOUNTER — Encounter: Payer: Self-pay | Admitting: Obstetrics & Gynecology

## 2016-06-20 VITALS — BP 96/64 | HR 85 | Resp 16 | Ht 59.0 in | Wt 113.0 lb

## 2016-06-20 DIAGNOSIS — Z113 Encounter for screening for infections with a predominantly sexual mode of transmission: Secondary | ICD-10-CM

## 2016-06-20 DIAGNOSIS — Z30430 Encounter for insertion of intrauterine contraceptive device: Secondary | ICD-10-CM

## 2016-06-20 DIAGNOSIS — Z01812 Encounter for preprocedural laboratory examination: Secondary | ICD-10-CM

## 2016-06-20 MED ORDER — LEVONORGESTREL 20 MCG/24HR IU IUD
INTRAUTERINE_SYSTEM | Freq: Once | INTRAUTERINE | Status: AC
  Administered 2016-06-20: 17:00:00 via INTRAUTERINE

## 2016-06-20 NOTE — Progress Notes (Signed)
IUD Procedure Note Patient identified, informed consent performed.  Discussed risks of irregular bleeding, cramping, infection, malpositioning or misplacement of the IUD outside the uterus which may require further procedures. Time out was performed.  Urine pregnancy test negative.  Speculum placed in the vagina.  Cervix visualized.  Cleaned with Betadine x 2.  Grasped anteriorly with a single tooth tenaculum.  Uterus sounded to 7 cm.  Mirena IUD placed per manufacturer's recommendations.  Strings trimmed to 2-3 cm. Tenaculum was removed, good hemostasis noted.  Patient tolerated procedure well.   Patient was given post-procedure instructions and the Mirena care card with expiration date.  Patient was also asked to check IUD strings periodically and follow up in 4-6 weeks for IUD check.

## 2016-06-22 LAB — URINE CYTOLOGY ANCILLARY ONLY
Chlamydia: NEGATIVE
Neisseria Gonorrhea: NEGATIVE

## 2016-06-29 DIAGNOSIS — R21 Rash and other nonspecific skin eruption: Secondary | ICD-10-CM | POA: Diagnosis not present

## 2016-08-01 ENCOUNTER — Ambulatory Visit: Payer: 59 | Admitting: Obstetrics & Gynecology

## 2016-08-03 ENCOUNTER — Ambulatory Visit (INDEPENDENT_AMBULATORY_CARE_PROVIDER_SITE_OTHER): Payer: 59 | Admitting: Obstetrics & Gynecology

## 2016-08-03 ENCOUNTER — Encounter: Payer: Self-pay | Admitting: Obstetrics & Gynecology

## 2016-08-03 VITALS — BP 100/63 | HR 82

## 2016-08-03 DIAGNOSIS — Z30431 Encounter for routine checking of intrauterine contraceptive device: Secondary | ICD-10-CM | POA: Diagnosis not present

## 2016-08-03 NOTE — Progress Notes (Signed)
   Subjective:    Patient ID: Paige Gamble, female    DOB: 1986-10-23, 30 y.o.   MRN: [id number]  HPI  Pt presents for IUD string check.  Pt has occasional cramping.  Pt also has some spotting at first but went several weeks without bleeding  She is satisfied with the Mirena.  Review of Systems  Gastrointestinal: Negative.   Genitourinary:       Mild occasional cramping and spotting after Mirena first put in.       Objective:   Physical Exam  Constitutional: She is oriented to person, place, and time. She appears well-developed and well-nourished. No distress.  HENT:  Head: Normocephalic and atraumatic.  Eyes: Conjunctivae are normal.  Pulmonary/Chest: Effort normal.  Abdominal: Soft. She exhibits no distension and no mass. There is no tenderness. There is no rebound and no guarding.  Genitourinary:  Genitourinary Comments: Tanner V Vagina:  Pink nml rugae Cervix closed and IUD strings at 3 cm Uterus NT  Musculoskeletal: She exhibits no edema.  Neurological: She is alert and oriented to person, place, and time.  Skin: Skin is warm and dry.  Psychiatric: She has a normal mood and affect.  Vitals reviewed.   Assessment & Plan:  30 yo female with nml IUD placement  Pap up to date RTC 1 year.

## 2017-01-09 DIAGNOSIS — H9202 Otalgia, left ear: Secondary | ICD-10-CM | POA: Diagnosis not present

## 2017-02-01 DIAGNOSIS — D649 Anemia, unspecified: Secondary | ICD-10-CM | POA: Diagnosis not present

## 2017-02-01 DIAGNOSIS — Z8632 Personal history of gestational diabetes: Secondary | ICD-10-CM | POA: Diagnosis not present

## 2017-02-01 DIAGNOSIS — Z8659 Personal history of other mental and behavioral disorders: Secondary | ICD-10-CM | POA: Diagnosis not present

## 2017-02-01 DIAGNOSIS — R413 Other amnesia: Secondary | ICD-10-CM | POA: Diagnosis not present

## 2017-02-01 DIAGNOSIS — R51 Headache: Secondary | ICD-10-CM | POA: Diagnosis not present

## 2017-02-01 DIAGNOSIS — Z0000 Encounter for general adult medical examination without abnormal findings: Secondary | ICD-10-CM | POA: Diagnosis not present

## 2017-02-01 DIAGNOSIS — F419 Anxiety disorder, unspecified: Secondary | ICD-10-CM | POA: Diagnosis not present

## 2017-10-12 DIAGNOSIS — N390 Urinary tract infection, site not specified: Secondary | ICD-10-CM | POA: Diagnosis not present

## 2017-10-12 DIAGNOSIS — R350 Frequency of micturition: Secondary | ICD-10-CM | POA: Diagnosis not present

## 2017-11-19 DIAGNOSIS — H5213 Myopia, bilateral: Secondary | ICD-10-CM | POA: Diagnosis not present

## 2017-12-28 DIAGNOSIS — N390 Urinary tract infection, site not specified: Secondary | ICD-10-CM | POA: Diagnosis not present

## 2017-12-28 DIAGNOSIS — R350 Frequency of micturition: Secondary | ICD-10-CM | POA: Diagnosis not present

## 2018-02-07 DIAGNOSIS — Z0000 Encounter for general adult medical examination without abnormal findings: Secondary | ICD-10-CM | POA: Diagnosis not present

## 2018-02-07 DIAGNOSIS — R350 Frequency of micturition: Secondary | ICD-10-CM | POA: Diagnosis not present

## 2018-02-07 DIAGNOSIS — R7303 Prediabetes: Secondary | ICD-10-CM | POA: Diagnosis not present

## 2018-02-07 DIAGNOSIS — E538 Deficiency of other specified B group vitamins: Secondary | ICD-10-CM | POA: Diagnosis not present

## 2018-02-07 DIAGNOSIS — B070 Plantar wart: Secondary | ICD-10-CM | POA: Diagnosis not present

## 2018-02-07 DIAGNOSIS — Z8632 Personal history of gestational diabetes: Secondary | ICD-10-CM | POA: Diagnosis not present

## 2018-02-07 DIAGNOSIS — Z87440 Personal history of urinary (tract) infections: Secondary | ICD-10-CM | POA: Diagnosis not present

## 2018-02-07 DIAGNOSIS — R202 Paresthesia of skin: Secondary | ICD-10-CM | POA: Diagnosis not present

## 2018-02-26 ENCOUNTER — Encounter: Payer: Self-pay | Admitting: Obstetrics and Gynecology

## 2018-02-26 ENCOUNTER — Ambulatory Visit (INDEPENDENT_AMBULATORY_CARE_PROVIDER_SITE_OTHER): Payer: 59 | Admitting: Obstetrics and Gynecology

## 2018-02-26 VITALS — BP 121/77 | HR 88 | Ht 60.0 in | Wt 108.0 lb

## 2018-02-26 DIAGNOSIS — R102 Pelvic and perineal pain: Secondary | ICD-10-CM

## 2018-02-26 DIAGNOSIS — R309 Painful micturition, unspecified: Secondary | ICD-10-CM | POA: Diagnosis not present

## 2018-02-26 DIAGNOSIS — Z1151 Encounter for screening for human papillomavirus (HPV): Secondary | ICD-10-CM

## 2018-02-26 DIAGNOSIS — Z124 Encounter for screening for malignant neoplasm of cervix: Secondary | ICD-10-CM

## 2018-02-26 DIAGNOSIS — Z113 Encounter for screening for infections with a predominantly sexual mode of transmission: Secondary | ICD-10-CM | POA: Diagnosis not present

## 2018-02-26 NOTE — Progress Notes (Signed)
32 yo G2P2 here for the evaluation of dysuria which started today and to check on her IUD strings. Patient reports onset of dysuria and frequency this morning. She reports some hematuria with wiping. She self medicated with pyridium which she had left over from a previous UTI. She denies any dyspareunia or abnormal discharge. She is not concerned about a possible STD. She is sexually active without discomfort. Patient had the IUD in place since 05/2016  Past Medical History:  Diagnosis Date  . Anxiety   . Diabetes mellitus without complication (HCC)    gestational with second preg  . Gestational diabetes mellitus (GDM), antepartum    Past Surgical History:  Procedure Laterality Date  . CESAREAN SECTION    . CESAREAN SECTION N/A 04/16/2016   Procedure: CESAREAN SECTION;  Surgeon: Tereso NewcomerUgonna A Anyanwu, MD;  Location: [facility];  Service: Obstetrics;  Laterality: N/A;  . WISDOM TOOTH EXTRACTION     Family History  Problem Relation Age of Onset  . Diabetes Mother   . Hypertension Father    Social History   Tobacco Use  . Smoking status: Never Smoker  . Smokeless tobacco: Never Used  Substance Use Topics  . Alcohol use: Yes    Alcohol/week: 0.0 oz  . Drug use: No   ROS See pertinent in HPI  Blood pressure 121/77, pulse 88, height 5' (1.524 m), weight 108 lb (49 kg), not currently breastfeeding. GENERAL: Well-developed, well-nourished female in no acute distress.  ABDOMEN: Soft, nontender, nondistended. No organomegaly. PELVIC: Normal external female genitalia. Vagina is pink and rugated.  Normal discharge. Normal appearing cervix, IUD strings not visualized. Uterus is normal in size. No adnexal mass or tenderness. EXTREMITIES: No cyanosis, clubbing, or edema, 2+ distal pulses.  A/P 32 yo G2P2 with new onset dysuria and non visualized IUD strings - urine and pelvic cultures collected - pap smear performed as patient is due  - Pelvic ultrasound obtained to confirm location of IUD -  Patient will be contacted with abnormal results - RTC prn

## 2018-02-27 LAB — GC/CHLAMYDIA PROBE AMP (~~LOC~~) NOT AT ARMC
Chlamydia: NEGATIVE
Neisseria Gonorrhea: NEGATIVE

## 2018-02-28 ENCOUNTER — Ambulatory Visit (HOSPITAL_BASED_OUTPATIENT_CLINIC_OR_DEPARTMENT_OTHER)
Admission: RE | Admit: 2018-02-28 | Discharge: 2018-02-28 | Disposition: A | Discharge status: Home / Self Care | Payer: 59 | Source: Ambulatory Visit | Attending: Obstetrics and Gynecology | Admitting: Obstetrics and Gynecology

## 2018-02-28 ENCOUNTER — Encounter: Payer: Self-pay | Admitting: Obstetrics and Gynecology

## 2018-02-28 DIAGNOSIS — N83202 Unspecified ovarian cyst, left side: Secondary | ICD-10-CM | POA: Insufficient documentation

## 2018-02-28 DIAGNOSIS — R102 Pelvic and perineal pain: Secondary | ICD-10-CM | POA: Insufficient documentation

## 2018-02-28 LAB — CYTOLOGY - PAP
Diagnosis: NEGATIVE
HPV: NOT DETECTED

## 2018-02-28 LAB — URINE CULTURE
MICRO NUMBER: 90282993
SPECIMEN QUALITY: ADEQUATE

## 2018-04-11 ENCOUNTER — Encounter: Payer: Self-pay | Admitting: Family Medicine

## 2018-04-11 ENCOUNTER — Ambulatory Visit (INDEPENDENT_AMBULATORY_CARE_PROVIDER_SITE_OTHER): Payer: 59 | Admitting: Family Medicine

## 2018-04-11 DIAGNOSIS — M79604 Pain in right leg: Secondary | ICD-10-CM | POA: Diagnosis not present

## 2018-04-11 DIAGNOSIS — M79605 Pain in left leg: Secondary | ICD-10-CM | POA: Diagnosis not present

## 2018-04-11 NOTE — Patient Instructions (Signed)
You do not have a stress fracture. The location of your pain would suggest you have a tibial stress reaction though. I would not run for 3 more weeks, follow up with me at that time - we will reevaluate and review return to running protocol. Ok to cycle at low resistance, swim, use elliptical if these do not cause pain. Icing, tylenol if needed. Calcium 1300mg  daily and vitamin D 800 IU daily (ok if you're taking more vitamin D than this from you primary care doctor). I would use some OTC inserts (spencos, superfeet, dr. Jari Sportsmanscholls active series) when you get back to running to decrease the rotational and compressive forces you're putting on your tibia. Follow up with me in 3 weeks but call me if you have any questions in the meantime.

## 2018-04-12 ENCOUNTER — Encounter: Payer: Self-pay | Admitting: Family Medicine

## 2018-04-12 DIAGNOSIS — M79604 Pain in right leg: Secondary | ICD-10-CM | POA: Insufficient documentation

## 2018-04-12 DIAGNOSIS — M79605 Pain in left leg: Principal | ICD-10-CM

## 2018-04-12 NOTE — Progress Notes (Signed)
PCP: Jerral RalphMyers, Stephanie J, MD  Subjective:   HPI: Patient is a 32 y.o. female here for bilateral shin pain.  Patient reports she has been running about 10-15 miles per week leading up to 3/7 when she developed soreness mainly in left shin but some in right shin too. Pain was focal, sharp, worse with running. Pain down to 1/10 on left, 0/10 on right. No prior history of stress fracture. She has been resting from running more recently but would like to do a half marathon at the end of the year. Told recently vitamin D was low so taking a little supplementation for this. She had been running 4-5 days a week When she was running she did run at a fast pace during her 30 minute break at work. She has a mirena IUD. No skin changes, numbness, swelling.  Past Medical History:  Diagnosis Date  . Anxiety   . Diabetes mellitus without complication (HCC)    gestational with second preg  . Gestational diabetes mellitus (GDM), antepartum     Current Outpatient Medications on File Prior to Visit  Medication Sig Dispense Refill  . ferrous sulfate 325 (65 FE) MG tablet Take 1 tablet (325 mg total) by mouth daily with breakfast. (Patient not taking: Reported on 02/26/2018) 30 tablet 3  . ibuprofen (ADVIL,MOTRIN) 600 MG tablet Take 1 tablet (600 mg total) by mouth every 6 (six) hours as needed for mild pain or cramping. (Patient not taking: Reported on 02/26/2018) 50 tablet 0  . levonorgestrel (MIRENA) 20 MCG/24HR IUD 1 each by Intrauterine route once.    . Prenatal Vit-Fe Fumarate-FA (MULTIVITAMIN-PRENATAL) 27-0.8 MG TABS tablet Take 1 tablet by mouth daily at 12 noon.    . vitamin B-12 (CYANOCOBALAMIN) 1000 MCG tablet Take by mouth.     No current facility-administered medications on file prior to visit.     Past Surgical History:  Procedure Laterality Date  . CESAREAN SECTION    . CESAREAN SECTION N/A 04/16/2016   Procedure: CESAREAN SECTION;  Surgeon: Tereso NewcomerUgonna A Anyanwu, MD;  Location: [facility];   Service: Obstetrics;  Laterality: N/A;  . WISDOM TOOTH EXTRACTION      No Known Allergies  Social History   Socioeconomic History  . Marital status: Married    Spouse name: Not on file  . Number of children: Not on file  . Years of education: Not on file  . Highest education level: Not on file  Occupational History  . 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 Financial resource strain: Not on file  . Food insecurity:    Worry: Not on file    Inability: Not on file  . Transportation needs:    Medical: Not on file    Non-medical: Not on file  Tobacco Use  . Smoking status: Never Smoker  . Smokeless tobacco: Never Used  Substance and Sexual Activity  . Alcohol use: Yes    Alcohol/week: 0.0 oz  . Drug use: No  . Sexual activity: Yes    Partners: Male    Birth control/protection: IUD  Lifestyle  . Physical activity:    Days per week: Not on file    Minutes per session: Not on file  . Stress: Not on file  Relationships  . Social connections:    Talks on phone: Not on file    Gets together: Not on file    Attends religious service: Not on file    Active member of club or organization: Not on file  Attends meetings of clubs or organizations: Not on file    Relationship status: Not on file  . Intimate partner violence:    Fear of current or ex partner: Not on file    Emotionally abused: Not on file    Physically abused: Not on file    Forced sexual activity: Not on file  Other Topics Concern  . Not on file  Social History Narrative  . Not on file    Family History  Problem Relation Age of Onset  . Diabetes Mother   . Hypertension Father     BP 114/85   Pulse 80   Ht 5' (1.524 m)   Wt 104 lb (47.2 kg)   BMI 20.31 kg/m   Review of Systems: See HPI above.     Objective:  Physical Exam:  Gen: NAD, comfortable in exam room  Right leg: No deformity, swelling, bruising. FROM with 5/5 strength. No tenderness to palpation - points to junction of middle  and distal 1/3rd of tibia as where she was hurting. Negative fulcrum. Negative hop test. NVI distally.  Left leg: No deformity, swelling, bruising. FROM with 5/5 strength. No tenderness to palpation - points to junction of middle and distal 1/3rd of tibia as where she was hurting. Negative fulcrum. Negative hop test. NVI distally.  MSK u/s bilateral lower legs:  No cortical irregularities, edema, neovascularity of tibias.   Assessment & Plan:  1. Bilateral lower leg pain - Patient has been resting from running for about 3 weeks now.  She describes location of pain would usually get a stress fracture though her ultrasound is reassuring - suggests stress reaction on the left side.  Advised not running 3 more weeks and follow up at that time.  Cycling, swimming, elliptical (if not painful).  Icing, tylenol if needed.  Calcium and vitamin D.  Encouraged OTC inserts when she gets back to running.

## 2018-04-12 NOTE — Assessment & Plan Note (Signed)
Patient has been resting from running for about 3 weeks now.  She describes location of pain would usually get a stress fracture though her ultrasound is reassuring - suggests stress reaction on the left side.  Advised not running 3 more weeks and follow up at that time.  Cycling, swimming, elliptical (if not painful).  Icing, tylenol if needed.  Calcium and vitamin D.  Encouraged OTC inserts when she gets back to running.

## 2018-04-30 ENCOUNTER — Encounter: Payer: Self-pay | Admitting: Family Medicine

## 2018-04-30 ENCOUNTER — Ambulatory Visit (INDEPENDENT_AMBULATORY_CARE_PROVIDER_SITE_OTHER): Payer: 59 | Admitting: Family Medicine

## 2018-04-30 DIAGNOSIS — M79604 Pain in right leg: Secondary | ICD-10-CM | POA: Diagnosis not present

## 2018-04-30 DIAGNOSIS — M79605 Pain in left leg: Secondary | ICD-10-CM | POA: Diagnosis not present

## 2018-04-30 NOTE — Assessment & Plan Note (Signed)
now 6 weeks out from not running.  Left shin stress reaction, shin splints on right.  Negative testing today.  We reviewed return to running program today.  Calcium, vitamin D.  Can cross train on off days.  Discussed OTC inserts again.  F/u prn if doing well.

## 2018-04-30 NOTE — Patient Instructions (Signed)
You're doing great! Continue calcium, vitamin D. Do home exercises including single leg calf raises 3-4 times a week - 3 sets of 10 once a day only for next 6 weeks. Icing, tylenol if needed for mild soreness but this pain shouldn't get about a 3 on a scale of 1-10. Return to running: Run only every other day for the next 3-4 weeks. Start with 1:1 ZOX:WRUE for 10 total minutes. Next step would be 2:1 jog: walk for 15 minutes Next 3:1 AVW:[id number] for 20 minutes. After 2 weeks you can do regular jog without walk interval. Call me if you have any problems otherwise follow up as needed.

## 2018-04-30 NOTE — Progress Notes (Signed)
PCP: Jerral Ralph, MD  Subjective:   HPI: Patient is a 32 y.o. female here for bilateral shin pain.  4/18: Patient reports she has been running about 10-15 miles per week leading up to 3/7 when she developed soreness mainly in left shin but some in right shin too. Pain was focal, sharp, worse with running. Pain down to 1/10 on left, 0/10 on right. No prior history of stress fracture. She has been resting from running more recently but would like to do a half marathon at the end of the year. Told recently vitamin D was low so taking a little supplementation for this. She had been running 4-5 days a week When she was running she did run at a fast pace during her 30 minute break at work. She has a mirena IUD. No skin changes, numbness, swelling.  5/7: Patient reports she's doing well. No pain, tenderness, soreness. Not taking any medicines for this. No skin changes, numbness.  Past Medical History:  Diagnosis Date  . Anxiety   . Diabetes mellitus without complication (HCC)    gestational with second preg  . Gestational diabetes mellitus (GDM), antepartum     Current Outpatient Medications on File Prior to Visit  Medication Sig Dispense Refill  . ferrous sulfate 325 (65 FE) MG tablet Take 1 tablet (325 mg total) by mouth daily with breakfast. (Patient not taking: Reported on 02/26/2018) 30 tablet 3  . ibuprofen (ADVIL,MOTRIN) 600 MG tablet Take 1 tablet (600 mg total) by mouth every 6 (six) hours as needed for mild pain or cramping. (Patient not taking: Reported on 02/26/2018) 50 tablet 0  . levonorgestrel (MIRENA) 20 MCG/24HR IUD 1 each by Intrauterine route once.    . Prenatal Vit-Fe Fumarate-FA (MULTIVITAMIN-PRENATAL) 27-0.8 MG TABS tablet Take 1 tablet by mouth daily at 12 noon.    . vitamin B-12 (CYANOCOBALAMIN) 1000 MCG tablet Take by mouth.     No current facility-administered medications on file prior to visit.     Past Surgical History:  Procedure Laterality Date   . CESAREAN SECTION    . CESAREAN SECTION N/A 04/16/2016   Procedure: CESAREAN SECTION;  Surgeon: Tereso Newcomer, MD;  Location: [facility];  Service: Obstetrics;  Laterality: N/A;  . WISDOM TOOTH EXTRACTION      No Known Allergies  Social History   Socioeconomic History  . Marital status: Married    Spouse name: Not on file  . Number of children: Not on file  . Years of education: Not on file  . Highest education level: Not on file  Occupational History  . Occupation: Med Recruitment consultant Needs  . Financial resource strain: Not on file  . Food insecurity:    Worry: Not on file    Inability: Not on file  . Transportation needs:    Medical: Not on file    Non-medical: Not on file  Tobacco Use  . Smoking status: Never Smoker  . Smokeless tobacco: Never Used  Substance and Sexual Activity  . Alcohol use: Yes    Alcohol/week: 0.0 oz  . Drug use: No  . Sexual activity: Yes    Partners: Male    Birth control/protection: IUD  Lifestyle  . Physical activity:    Days per week: Not on file    Minutes per session: Not on file  . Stress: Not on file  Relationships  . Social connections:    Talks on phone: Not on file    Gets together: Not on  file    Attends religious service: Not on file    Active member of club or organization: Not on file    Attends meetings of clubs or organizations: Not on file    Relationship status: Not on file  . Intimate partner violence:    Fear of current or ex partner: Not on file    Emotionally abused: Not on file    Physically abused: Not on file    Forced sexual activity: Not on file  Other Topics Concern  . Not on file  Social History Narrative  . Not on file    Family History  Problem Relation Age of Onset  . Diabetes Mother   . Hypertension Father     BP 116/79   Pulse 83   Ht 5' (1.524 m)   Wt 104 lb (47.2 kg)   BMI 20.31 kg/m   Review of Systems: See HPI above.     Objective:  Physical Exam:  Gen: NAD, comfortable in  exam room  Bilateral legs: No gross deformity, swelling, bruising. FROM with 5/5 strength ankle motions. No tenderness to palpation. Negative fulcrum and hop tests. NVI distally.   Assessment & Plan:  1. Bilateral lower leg pain - now 6 weeks out from not running.  Left shin stress reaction, shin splints on right.  Negative testing today.  We reviewed return to running program today.  Calcium, vitamin D.  Can cross train on off days.  Discussed OTC inserts again.  F/u prn if doing well.

## 2018-05-22 DIAGNOSIS — R102 Pelvic and perineal pain: Secondary | ICD-10-CM | POA: Diagnosis not present

## 2018-05-22 DIAGNOSIS — T8332XA Displacement of intrauterine contraceptive device, initial encounter: Secondary | ICD-10-CM | POA: Diagnosis not present

## 2018-06-16 DIAGNOSIS — H60502 Unspecified acute noninfective otitis externa, left ear: Secondary | ICD-10-CM | POA: Diagnosis not present

## 2018-06-23 ENCOUNTER — Telehealth: Payer: 59 | Admitting: Physician Assistant

## 2018-06-23 DIAGNOSIS — R300 Dysuria: Secondary | ICD-10-CM | POA: Diagnosis not present

## 2018-06-23 MED ORDER — CEPHALEXIN 500 MG PO CAPS: 500.0000 mg | ORAL_CAPSULE | Freq: Two times a day (BID) | ORAL | 0 refills | Status: AC

## 2018-06-23 NOTE — Progress Notes (Signed)

## 2018-06-24 MED FILL — CEPHALEXIN 500 MG CAPSULE: 500 | 7 days supply | Qty: 14 | Fill #0

## 2018-08-16 MED FILL — SERTRALINE HCL 25 MG TABLET: 25 | 90 days supply | Qty: 90 | Fill #0

## 2018-10-31 ENCOUNTER — Encounter: Payer: Self-pay | Admitting: Family Medicine

## 2018-10-31 ENCOUNTER — Ambulatory Visit (INDEPENDENT_AMBULATORY_CARE_PROVIDER_SITE_OTHER): Payer: 59 | Admitting: Family Medicine

## 2018-10-31 VITALS — BP 118/86 | HR 90 | Ht 60.0 in | Wt 103.0 lb

## 2018-10-31 DIAGNOSIS — M25551 Pain in right hip: Secondary | ICD-10-CM | POA: Diagnosis not present

## 2018-10-31 NOTE — Patient Instructions (Signed)
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 Don't run between now and your half marathon. Ice over area of pain 3-4 times a day for 15 minutes at a time as needed. Standing hip rotations, hip side raise exercise 3 sets of 10 once a day - add weights if these become too easy. Stretches - pick 2-3 and hold for 20-30 seconds x 3 - do once or twice a day. Tylenol and/or aleve as needed for pain - consider taking just tylenol the morning of your race. If not improving, can consider physical therapy and/or steroid injection but you're unlikely to need either of these. If you develop pain in the groin (this is unlikely), call me Follow up with me in 1 month.

## 2018-10-31 NOTE — Progress Notes (Signed)
PCP: Jerral Ralph, MD  Subjective:   HPI: Patient is a 32 y.o. female here for right hip pain.  Patient reports she's currently training for a half marathon that's on Saturday. She has had some occasional pain right hip for a couple weeks that improves with running. However, last Thursday she noticed throughout her 5 mile run she had lateral right hip pain with clicking. No groin pain. Pain is 2/10, not bothering really with walking. Tried ibuprofen which helps some. Some numbness in right foot intermittently. No back pain. No bowel/bladder dysfunction. Running 20 miles a week.  Past Medical History:  Diagnosis Date  . Anxiety   . Diabetes mellitus without complication (HCC)    gestational with second preg  . Gestational diabetes mellitus (GDM), antepartum     Current Outpatient Medications on File Prior to Visit  Medication Sig Dispense Refill  . ferrous sulfate 325 (65 FE) MG tablet Take 1 tablet (325 mg total) by mouth daily with breakfast. (Patient not taking: Reported on 02/26/2018) 30 tablet 3  . levonorgestrel (MIRENA) 20 MCG/24HR IUD 1 each by Intrauterine route once.    . Prenatal Vit-Fe Fumarate-FA (MULTIVITAMIN-PRENATAL) 27-0.8 MG TABS tablet Take 1 tablet by mouth daily at 12 noon.    . sertraline (ZOLOFT) 25 MG tablet   3  . vitamin B-12 (CYANOCOBALAMIN) 1000 MCG tablet Take by mouth.     No current facility-administered medications on file prior to visit.     Past Surgical History:  Procedure Laterality Date  . CESAREAN SECTION    . CESAREAN SECTION N/A 04/16/2016   Procedure: CESAREAN SECTION;  Surgeon: Tereso Newcomer, MD;  Location: [facility];  Service: Obstetrics;  Laterality: N/A;  . WISDOM TOOTH EXTRACTION      No Known Allergies  Social History   Socioeconomic History  . Marital status: Married    Spouse name: Not on file  . Number of children: Not on file  . Years of education: Not on file  . Highest education level: Not on file   Occupational History  . Occupation: Med Recruitment consultant Needs  . Financial resource strain: Not on file  . Food insecurity:    Worry: Not on file    Inability: Not on file  . Transportation needs:    Medical: Not on file    Non-medical: Not on file  Tobacco Use  . Smoking status: Never Smoker  . Smokeless tobacco: Never Used  Substance and Sexual Activity  . Alcohol use: Yes    Alcohol/week: 0.0 standard drinks  . Drug use: No  . Sexual activity: Yes    Partners: Male    Birth control/protection: IUD  Lifestyle  . Physical activity:    Days per week: Not on file    Minutes per session: Not on file  . Stress: Not on file  Relationships  . Social connections:    Talks on phone: Not on file    Gets together: Not on file    Attends religious service: Not on file    Active member of club or organization: Not on file    Attends meetings of clubs or organizations: Not on file    Relationship status: Not on file  . Intimate partner violence:    Fear of current or ex partner: Not on file    Emotionally abused: Not on file    Physically abused: Not on file    Forced sexual activity: Not on file  Other Topics Concern  .  Not on file  Social History Narrative  . Not on file    Family History  Problem Relation Age of Onset  . Diabetes Mother   . Hypertension Father     BP 118/86   Pulse 90   Ht 5' (1.524 m)   Wt 103 lb (46.7 kg)   BMI 20.12 kg/m   Review of Systems: See HPI above.     Objective:  Physical Exam:  Gen: NAD, comfortable in exam room  Back: No gross deformity, scoliosis. No TTP.  No midline or bony TTP. FROM. Strength LEs 5/5 all muscle groups.   2+ MSRs in patellar and achilles tendons, equal bilaterally. Negative SLRs. Sensation intact to light touch bilaterally.  Right hip: No deformity. FROM with 5/5 strength including hip abduction. Mild TTP over greater trochanter. NVI distally. Negative hop test. Negative logroll, clunk. Negative  fabers, piriformis, fadirs.   Assessment & Plan:  1. Right hip pain - patient's exam is reassuring that she's not dealing with a stress fracture or intraarticular hip pathology (labral tear).  Reassured her.  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  Icing.  Tylenol and/or aleve.  Consider physical therapy if not improving.  F/u in 1 month.

## 2018-11-26 ENCOUNTER — Ambulatory Visit (INDEPENDENT_AMBULATORY_CARE_PROVIDER_SITE_OTHER): Payer: 59 | Admitting: Family Medicine

## 2018-11-26 ENCOUNTER — Encounter: Payer: Self-pay | Admitting: Family Medicine

## 2018-11-26 VITALS — BP 103/71 | HR 72 | Ht 60.0 in | Wt 105.0 lb

## 2018-11-26 DIAGNOSIS — M25551 Pain in right hip: Secondary | ICD-10-CM

## 2018-11-26 NOTE — Progress Notes (Signed)
PCP: Jerral Ralph, MD  Subjective:   HPI: Patient is a 32 y.o. female here for right hip pain.  11/7: Patient reports she's currently training for a half marathon that's on Saturday. She has had some occasional pain right hip for a couple weeks that improves with running. However, last Thursday she noticed throughout her 5 mile run she had lateral right hip pain with clicking. No groin pain. Pain is 2/10, not bothering really with walking. Tried ibuprofen which helps some. Some numbness in right foot intermittently. No back pain. No bowel/bladder dysfunction. Running 20 miles a week.  12/3: Patient reports she's improved compared to last visit. Pain worsened after running half-marathon but she was able to do so without problems. Pain maximum level 3/10 laterally, some soreness into buttock. Doing some of the exercises and stretches but not regularly. Has not run since the race. Worse in morning, stepping over things No skin changes.  Past Medical History:  Diagnosis Date  . Anxiety   . Diabetes mellitus without complication (HCC)    gestational with second preg  . Gestational diabetes mellitus (GDM), antepartum     Current Outpatient Medications on File Prior to Visit  Medication Sig Dispense Refill  . ferrous sulfate 325 (65 FE) MG tablet Take 1 tablet (325 mg total) by mouth daily with breakfast. (Patient not taking: Reported on 02/26/2018) 30 tablet 3  . levonorgestrel (MIRENA) 20 MCG/24HR IUD 1 each by Intrauterine route once.    . Prenatal Vit-Fe Fumarate-FA (MULTIVITAMIN-PRENATAL) 27-0.8 MG TABS tablet Take 1 tablet by mouth daily at 12 noon.    . sertraline (ZOLOFT) 25 MG tablet   3  . vitamin B-12 (CYANOCOBALAMIN) 1000 MCG tablet Take by mouth.     No current facility-administered medications on file prior to visit.     Past Surgical History:  Procedure Laterality Date  . CESAREAN SECTION    . CESAREAN SECTION N/A 04/16/2016   Procedure: CESAREAN SECTION;   Surgeon: Tereso Newcomer, MD;  Location: [facility];  Service: Obstetrics;  Laterality: N/A;  . WISDOM TOOTH EXTRACTION      No Known Allergies  Social History   Socioeconomic History  . Marital status: Married    Spouse name: Not on file  . Number of children: Not on file  . Years of education: Not on file  . Highest education level: Not on file  Occupational History  . Occupation: Med Recruitment consultant Needs  . Financial resource strain: Not on file  . Food insecurity:    Worry: Not on file    Inability: Not on file  . Transportation needs:    Medical: Not on file    Non-medical: Not on file  Tobacco Use  . Smoking status: Never Smoker  . Smokeless tobacco: Never Used  Substance and Sexual Activity  . Alcohol use: Yes    Alcohol/week: 0.0 standard drinks  . Drug use: No  . Sexual activity: Yes    Partners: Male    Birth control/protection: IUD  Lifestyle  . Physical activity:    Days per week: Not on file    Minutes per session: Not on file  . Stress: Not on file  Relationships  . Social connections:    Talks on phone: Not on file    Gets together: Not on file    Attends religious service: Not on file    Active member of club or organization: Not on file    Attends meetings of clubs or  organizations: Not on file    Relationship status: Not on file  . Intimate partner violence:    Fear of current or ex partner: Not on file    Emotionally abused: Not on file    Physically abused: Not on file    Forced sexual activity: Not on file  Other Topics Concern  . Not on file  Social History Narrative  . Not on file    Family History  Problem Relation Age of Onset  . Diabetes Mother   . Hypertension Father     BP 103/71   Pulse 72   Ht 5' (1.524 m)   Wt 105 lb (47.6 kg)   BMI 20.51 kg/m   Review of Systems: See HPI above.     Objective:  Physical Exam:  Gen: NAD, comfortable in exam room  Right hip: No deformity. FROM with 5/5 strength. No tenderness  to palpation. NVI distally. Negative logroll. Negative hop test. Negative fulcrum, Fadir, piriformis, faber.   Assessment & Plan:  1. Right hip pain - exam again reassuring against stress fracture, intraarticular hip pathology such as labral tear.  Consistent with snapping hip syndrome.  Encouraged to do home exercises regularly.  Tylenol, aleve if needed.  Consider physical therapy, injection if not improving.  F/u in 6 weeks.

## 2018-11-26 NOTE — Patient Instructions (Signed)
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 Cross train with cycling, swimming, walking for exercise. Ice over area of pain 3-4 times a day for 15 minutes at a time as needed. Standing hip rotations, hip side raise exercise 3 sets of 10 once a day - add weights if these become too easy. Stretches - pick 2-3 and hold for 20-30 seconds x 3 - do once or twice a day. Tylenol and/or aleve as needed for pain. If not improving, can consider physical therapy and/or steroid injection but you're unlikely to need either of these. If you develop pain in the groin (this is unlikely), call me Follow up with me in 6 weeks.

## 2019-01-08 ENCOUNTER — Encounter: Payer: Self-pay | Admitting: Family Medicine

## 2019-01-08 ENCOUNTER — Ambulatory Visit (INDEPENDENT_AMBULATORY_CARE_PROVIDER_SITE_OTHER): Payer: 59 | Admitting: Family Medicine

## 2019-01-08 VITALS — BP 122/86 | HR 89 | Ht 60.0 in | Wt 107.0 lb

## 2019-01-08 DIAGNOSIS — M25551 Pain in right hip: Secondary | ICD-10-CM | POA: Diagnosis not present

## 2019-01-08 NOTE — Patient Instructions (Signed)
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 Cross train with cycling, swimming, walking for exercise but at this time you can start walk:jog intervals at this time if you want to get back into running (usually 1:1 minute walk:jog for total of 10 minutes;  Next run 1:2 walk:jog for 15 minutes, etc) Ice over area of pain 3-4 times a day for 15 minutes at a time as needed. Standing hip rotations, hip side raise exercise 3 sets of 10 once a day - add weights if these become too easy. Stretches - pick 2-3 and hold for 20-30 seconds x 3 - do once or twice a day. Tylenol and/or aleve as needed for pain. If not improving, can consider physical therapy and/or steroid injection but you're unlikely to need either of these. Follow up with me as needed.

## 2019-01-08 NOTE — Progress Notes (Signed)
PCP: Jerral RalphMyers, Stephanie J, MD  Subjective:   HPI: Patient is a 33 y.o. female here for right hip pain.  11/7: Patient reports she's currently training for a half marathon that's on Saturday. She has had some occasional pain right hip for a couple weeks that improves with running. However, last Thursday she noticed throughout her 5 mile run she had lateral right hip pain with clicking. No groin pain. Pain is 2/10, not bothering really with walking. Tried ibuprofen which helps some. Some numbness in right foot intermittently. No back pain. No bowel/bladder dysfunction. Running 20 miles a week.  12/3: Patient reports she's improved compared to last visit. Pain worsened after running half-marathon but she was able to do so without problems. Pain maximum level 3/10 laterally, some soreness into buttock. Doing some of the exercises and stretches but not regularly. Has not run since the race. Worse in morning, stepping over things No skin changes.  1/15: Patient reports she's doing well. Pain level 0/10. Has a lot of stairs in her house and last week had more pain anterior, posterior hip. Some associated snapping when waking up but eases up. No radiation. No skin changes.  Past Medical History:  Diagnosis Date  . Anxiety   . Diabetes mellitus without complication (HCC)    gestational with second preg  . Gestational diabetes mellitus (GDM), antepartum     Current Outpatient Medications on File Prior to Visit  Medication Sig Dispense Refill  . ferrous sulfate 325 (65 FE) MG tablet Take 1 tablet (325 mg total) by mouth daily with breakfast. (Patient not taking: Reported on 02/26/2018) 30 tablet 3  . levonorgestrel (MIRENA) 20 MCG/24HR IUD 1 each by Intrauterine route once.    . Prenatal Vit-Fe Fumarate-FA (MULTIVITAMIN-PRENATAL) 27-0.8 MG TABS tablet Take 1 tablet by mouth daily at 12 noon.    . sertraline (ZOLOFT) 25 MG tablet   3  . vitamin B-12 (CYANOCOBALAMIN) 1000 MCG tablet  Take by mouth.     No current facility-administered medications on file prior to visit.     Past Surgical History:  Procedure Laterality Date  . CESAREAN SECTION    . CESAREAN SECTION N/A 04/16/2016   Procedure: CESAREAN SECTION;  Surgeon: Tereso NewcomerUgonna A Anyanwu, MD;  Location: [facility];  Service: Obstetrics;  Laterality: N/A;  . WISDOM TOOTH EXTRACTION      No Known Allergies  Social History   Socioeconomic History  . Marital status: Married    Spouse name: Not on file  . Number of children: Not on file  . Years of education: Not on file  . Highest education level: Not on file  Occupational History  . 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 Financial resource strain: Not on file  . Food insecurity:    Worry: Not on file    Inability: Not on file  . Transportation needs:    Medical: Not on file    Non-medical: Not on file  Tobacco Use  . Smoking status: Never Smoker  . Smokeless tobacco: Never Used  Substance and Sexual Activity  . Alcohol use: Yes    Alcohol/week: 0.0 standard drinks  . Drug use: No  . Sexual activity: Yes    Partners: Male    Birth control/protection: I.U.D.  Lifestyle  . Physical activity:    Days per week: Not on file    Minutes per session: Not on file  . Stress: Not on file  Relationships  . Social connections:    Talks on  phone: Not on file    Gets together: Not on file    Attends religious service: Not on file    Active member of club or organization: Not on file    Attends meetings of clubs or organizations: Not on file    Relationship status: Not on file  . Intimate partner violence:    Fear of current or ex partner: Not on file    Emotionally abused: Not on file    Physically abused: Not on file    Forced sexual activity: Not on file  Other Topics Concern  . Not on file  Social History Narrative  . Not on file    Family History  Problem Relation Age of Onset  . Diabetes Mother   . Hypertension Father     BP 122/86   Pulse 89    Ht 5' (1.524 m)   Wt 107 lb (48.5 kg)   BMI 20.90 kg/m   Review of Systems: See HPI above.     Objective:  Physical Exam:  Gen: NAD, comfortable in exam room  Right hip: No deformity. FROM with 5/5 strength. No tenderness to palpation. NVI distally. Negative logroll, fadir, faber, piriformis.   Assessment & Plan:  1. Right hip pain - 2/2 snapping hip syndrome.  Much improved.  Encouraged home exercises regularly.  Consider physical therapy as next step if not improving.  Consider MR arthrogram, injection if struggles.  F/u prn.

## 2019-02-11 DIAGNOSIS — Z8632 Personal history of gestational diabetes: Secondary | ICD-10-CM | POA: Diagnosis not present

## 2019-02-11 DIAGNOSIS — Z0000 Encounter for general adult medical examination without abnormal findings: Secondary | ICD-10-CM | POA: Diagnosis not present

## 2019-02-11 DIAGNOSIS — N83202 Unspecified ovarian cyst, left side: Secondary | ICD-10-CM | POA: Diagnosis not present

## 2019-02-11 DIAGNOSIS — R202 Paresthesia of skin: Secondary | ICD-10-CM | POA: Diagnosis not present

## 2019-02-11 DIAGNOSIS — E538 Deficiency of other specified B group vitamins: Secondary | ICD-10-CM | POA: Diagnosis not present

## 2019-04-22 MED FILL — SERTRALINE HCL 25 MG TABLET: 25 | 90 days supply | Qty: 90 | Fill #1

## 2019-07-09 DIAGNOSIS — R7989 Other specified abnormal findings of blood chemistry: Secondary | ICD-10-CM | POA: Diagnosis not present

## 2019-07-09 DIAGNOSIS — R202 Paresthesia of skin: Secondary | ICD-10-CM | POA: Diagnosis not present

## 2019-07-16 ENCOUNTER — Telehealth: Payer: 59 | Admitting: Nurse Practitioner

## 2019-07-16 DIAGNOSIS — N3000 Acute cystitis without hematuria: Secondary | ICD-10-CM

## 2019-07-16 MED ORDER — CEPHALEXIN 500 MG PO CAPS: 500.0000 mg | ORAL_CAPSULE | Freq: Two times a day (BID) | ORAL | 0 refills | Status: DC

## 2019-07-16 MED FILL — CEPHALEXIN 500 MG CAPSULE: 500 | 7 days supply | Qty: 14 | Fill #0

## 2019-07-16 NOTE — Progress Notes (Signed)

## 2019-09-09 DIAGNOSIS — R202 Paresthesia of skin: Secondary | ICD-10-CM | POA: Diagnosis not present

## 2019-09-09 DIAGNOSIS — R232 Flushing: Secondary | ICD-10-CM | POA: Diagnosis not present

## 2019-09-09 DIAGNOSIS — E538 Deficiency of other specified B group vitamins: Secondary | ICD-10-CM | POA: Diagnosis not present

## 2019-09-12 DIAGNOSIS — E28319 Asymptomatic premature menopause: Secondary | ICD-10-CM | POA: Diagnosis not present

## 2019-09-12 DIAGNOSIS — Z01419 Encounter for gynecological examination (general) (routine) without abnormal findings: Secondary | ICD-10-CM | POA: Diagnosis not present

## 2019-09-30 DIAGNOSIS — I498 Other specified cardiac arrhythmias: Secondary | ICD-10-CM | POA: Diagnosis not present

## 2019-09-30 DIAGNOSIS — R079 Chest pain, unspecified: Secondary | ICD-10-CM | POA: Diagnosis not present

## 2019-10-10 DIAGNOSIS — R079 Chest pain, unspecified: Secondary | ICD-10-CM | POA: Diagnosis not present

## 2019-10-10 DIAGNOSIS — I081 Rheumatic disorders of both mitral and tricuspid valves: Secondary | ICD-10-CM | POA: Diagnosis not present

## 2019-10-24 MED FILL — FLUoxetine HCL 10 MG CAPS: 10 | 30 days supply | Qty: 30 | Fill #1

## 2019-10-27 ENCOUNTER — Other Ambulatory Visit: Payer: Self-pay | Admitting: Obstetrics & Gynecology

## 2019-10-27 MED FILL — ESTRADIOL 0.05 MG/24HR PTTW: 0.05 | 28 days supply | Qty: 8 | Fill #0

## 2019-11-06 DIAGNOSIS — M25552 Pain in left hip: Secondary | ICD-10-CM | POA: Diagnosis not present

## 2019-11-06 DIAGNOSIS — F419 Anxiety disorder, unspecified: Secondary | ICD-10-CM | POA: Diagnosis not present

## 2019-11-06 DIAGNOSIS — R202 Paresthesia of skin: Secondary | ICD-10-CM | POA: Diagnosis not present

## 2019-11-24 MED FILL — DOTTI 0.1 MG/24HR PTTW: 0.1 | 28 days supply | Qty: 8 | Fill #0

## 2019-11-24 MED FILL — FLUoxetine HCL 10 MG CAPS: 10 | 30 days supply | Qty: 30 | Fill #2

## 2019-12-22 MED FILL — DOTTI 0.1 MG/24HR PTTW: 0.1 | 28 days supply | Qty: 8 | Fill #1

## 2020-01-19 MED FILL — DOTTI 0.1 MG/24HR PTTW: 0.1 | 28 days supply | Qty: 8 | Fill #2

## 2020-02-17 MED FILL — DOTTI 0.1 MG/24HR PTTW: 0.1 | 28 days supply | Qty: 8 | Fill #3

## 2020-02-19 DIAGNOSIS — Z0000 Encounter for general adult medical examination without abnormal findings: Secondary | ICD-10-CM | POA: Diagnosis not present

## 2020-02-19 DIAGNOSIS — Z8632 Personal history of gestational diabetes: Secondary | ICD-10-CM | POA: Diagnosis not present

## 2020-02-19 DIAGNOSIS — F419 Anxiety disorder, unspecified: Secondary | ICD-10-CM | POA: Diagnosis not present

## 2020-03-15 MED FILL — DOTTI 0.1 MG/24HR PTTW: 0.1 | 28 days supply | Qty: 8 | Fill #4

## 2020-04-13 MED FILL — DOTTI 0.1 MG/24HR PTTW: 0.1 | 28 days supply | Qty: 8 | Fill #5

## 2020-04-14 ENCOUNTER — Ambulatory Visit
Admission: RE | Admit: 2020-04-14 | Discharge: 2020-04-14 | Disposition: A | Discharge status: Home / Self Care | Payer: 59 | Source: Ambulatory Visit | Attending: Family Medicine | Admitting: Family Medicine

## 2020-04-14 ENCOUNTER — Other Ambulatory Visit: Payer: Self-pay

## 2020-04-14 ENCOUNTER — Ambulatory Visit (INDEPENDENT_AMBULATORY_CARE_PROVIDER_SITE_OTHER): Payer: 59 | Admitting: Family Medicine

## 2020-04-14 ENCOUNTER — Encounter: Payer: Self-pay | Admitting: Family Medicine

## 2020-04-14 ENCOUNTER — Other Ambulatory Visit: Payer: Self-pay | Admitting: Family Medicine

## 2020-04-14 VITALS — BP 104/78 | Ht 59.0 in | Wt 114.0 lb

## 2020-04-14 DIAGNOSIS — M25551 Pain in right hip: Secondary | ICD-10-CM

## 2020-04-14 DIAGNOSIS — M25552 Pain in left hip: Secondary | ICD-10-CM

## 2020-04-14 NOTE — Progress Notes (Signed)
PCP: Parke Poisson, MD  Subjective:   HPI: Patient is a 34 y.o. female here for bilateral hip pain.  Patient reports she's had about 3 weeks of bilateral hip pain. She was running less than 20 miles a week prior to onset of this. Pain is intermittent and not always the same hip. Feels like it's anterolateral. No numbness or tingling. Did have some pain once down left leg that included left knee. No back pain. Has not been doing home exercises from prior visit.  Past Medical History:  Diagnosis Date  . Anxiety   . Diabetes mellitus without complication ([place])    gestational with second preg  . Gestational diabetes mellitus (GDM), antepartum     Current Outpatient Medications on File Prior to Visit  Medication Sig Dispense Refill  . estradiol (VIVELLE-DOT) 0.1 MG/24HR patch PLACE 1 PATCH (0.1 MG TOTAL) ONTO THE SKIN 2 (TWO) TIMES A WEEK. 8 patch 12  . ferrous sulfate 325 (65 FE) MG tablet Take 1 tablet (325 mg total) by mouth daily with breakfast. (Patient not taking: Reported on 02/26/2018) 30 tablet 3  . FLUoxetine (PROZAC) 10 MG capsule Take 10 mg by mouth daily.    Marland Kitchen levonorgestrel (MIRENA) 20 MCG/24HR IUD 1 each by Intrauterine route once.    . Prenatal Vit-Fe Fumarate-FA (MULTIVITAMIN-PRENATAL) 27-0.8 MG TABS tablet Take 1 tablet by mouth daily at 12 noon.    . vitamin B-12 (CYANOCOBALAMIN) 1000 MCG tablet Take by mouth.     No current facility-administered medications on file prior to visit.    Past Surgical History:  Procedure Laterality Date  . CESAREAN SECTION    . CESAREAN SECTION N/A 04/16/2016   Procedure: CESAREAN SECTION;  Surgeon: Osborne Oman, MD;  Location: [facility];  Service: Obstetrics;  Laterality: N/A;  . WISDOM TOOTH EXTRACTION      No Known Allergies  Social History   Socioeconomic History  . Marital status: Married    Spouse name: Not on file  . Number of children: Not on file  . Years of education: Not on file  . Highest education level:  Not on file  Occupational History  . Occupation: Med tech  Tobacco Use  . Smoking status: Never Smoker  . Smokeless tobacco: Never Used  Substance and Sexual Activity  . Alcohol use: Yes    Alcohol/week: 0.0 standard drinks  . Drug use: No  . Sexual activity: Yes    Partners: Male    Birth control/protection: I.U.D.  Other Topics Concern  . Not on file  Social History Narrative  . Not on file   Social Determinants of Health   Financial Resource Strain:   . Difficulty of Paying Living Expenses:   Food Insecurity:   . Worried About Charity fundraiser in the Last Year:   . Arboriculturist in the Last Year:   Transportation Needs:   . Film/video editor (Medical):   Marland Kitchen Lack of Transportation (Non-Medical):   Physical Activity:   . Days of Exercise per Week:   . Minutes of Exercise per Session:   Stress:   . Feeling of Stress :   Social Connections:   . Frequency of Communication with Friends and Family:   . Frequency of Social Gatherings with Friends and Family:   . Attends Religious Services:   . Active Member of Clubs or Organizations:   . Attends Archivist Meetings:   Marland Kitchen Marital Status:   Intimate Partner Violence:   .  Fear of Current or Ex-Partner:   . Emotionally Abused:   Marland Kitchen Physically Abused:   . Sexually Abused:     Family History  Problem Relation Age of Onset  . Diabetes Mother   . Hypertension Father     BP 104/78   Ht 4\' 11"  (1.499 m)   Wt 114 lb (51.7 kg)   BMI 23.03 kg/m   Review of Systems: See HPI above.     Objective:  Physical Exam:  Gen: NAD, comfortable in exam room  Right hip: No deformity. FROM with 5/5 strength including hip abduction. No tenderness to palpation. NVI distally. Negative logroll, faber, fadir.  Left hip: No deformity. FROM with 5/5 strength including hip abduction. No tenderness to palpation. NVI distally. Negative logroll, faber, fadir.   Assessment & Plan:  1. Bilateral hip pain - her  exam and history are reassuring.  No abnormalities noted today.  She has a family history of arthritis - will obtain radiographs today.  Aleve as needed.  Will likely recommend PT but will await radiograph results.  F/u in 1 month.

## 2020-04-14 NOTE — Patient Instructions (Signed)
Your exam is very reassuring today. Get x-rays of your hips after you leave today. If these look normal I would recommend starting physical therapy (and doing home exercises on days you don't go to therapy). Aleve 1-2 tabs twice a day with food as needed also. Icing 15 minutes at a time as needed. Follow up with me in about 1 month for reevaluation.

## 2020-04-19 DIAGNOSIS — M25551 Pain in right hip: Secondary | ICD-10-CM | POA: Diagnosis not present

## 2020-04-19 DIAGNOSIS — M259 Joint disorder, unspecified: Secondary | ICD-10-CM | POA: Diagnosis not present

## 2020-04-19 DIAGNOSIS — R531 Weakness: Secondary | ICD-10-CM | POA: Diagnosis not present

## 2020-04-19 DIAGNOSIS — M25552 Pain in left hip: Secondary | ICD-10-CM | POA: Diagnosis not present

## 2020-04-21 DIAGNOSIS — R531 Weakness: Secondary | ICD-10-CM | POA: Diagnosis not present

## 2020-04-21 DIAGNOSIS — M25552 Pain in left hip: Secondary | ICD-10-CM | POA: Diagnosis not present

## 2020-04-21 DIAGNOSIS — M259 Joint disorder, unspecified: Secondary | ICD-10-CM | POA: Diagnosis not present

## 2020-04-21 DIAGNOSIS — M25551 Pain in right hip: Secondary | ICD-10-CM | POA: Diagnosis not present

## 2020-04-23 DIAGNOSIS — M25552 Pain in left hip: Secondary | ICD-10-CM | POA: Diagnosis not present

## 2020-04-23 DIAGNOSIS — R531 Weakness: Secondary | ICD-10-CM | POA: Diagnosis not present

## 2020-04-23 DIAGNOSIS — M25551 Pain in right hip: Secondary | ICD-10-CM | POA: Diagnosis not present

## 2020-04-23 DIAGNOSIS — M259 Joint disorder, unspecified: Secondary | ICD-10-CM | POA: Diagnosis not present

## 2020-04-26 DIAGNOSIS — M259 Joint disorder, unspecified: Secondary | ICD-10-CM | POA: Diagnosis not present

## 2020-04-26 DIAGNOSIS — R531 Weakness: Secondary | ICD-10-CM | POA: Diagnosis not present

## 2020-04-26 DIAGNOSIS — M25551 Pain in right hip: Secondary | ICD-10-CM | POA: Diagnosis not present

## 2020-04-26 DIAGNOSIS — M25552 Pain in left hip: Secondary | ICD-10-CM | POA: Diagnosis not present

## 2020-04-28 DIAGNOSIS — M25552 Pain in left hip: Secondary | ICD-10-CM | POA: Diagnosis not present

## 2020-04-28 DIAGNOSIS — R531 Weakness: Secondary | ICD-10-CM | POA: Diagnosis not present

## 2020-04-28 DIAGNOSIS — M25551 Pain in right hip: Secondary | ICD-10-CM | POA: Diagnosis not present

## 2020-04-28 DIAGNOSIS — M259 Joint disorder, unspecified: Secondary | ICD-10-CM | POA: Diagnosis not present

## 2020-04-30 DIAGNOSIS — M259 Joint disorder, unspecified: Secondary | ICD-10-CM | POA: Diagnosis not present

## 2020-04-30 DIAGNOSIS — M25552 Pain in left hip: Secondary | ICD-10-CM | POA: Diagnosis not present

## 2020-04-30 DIAGNOSIS — M25551 Pain in right hip: Secondary | ICD-10-CM | POA: Diagnosis not present

## 2020-04-30 DIAGNOSIS — R531 Weakness: Secondary | ICD-10-CM | POA: Diagnosis not present

## 2020-05-04 DIAGNOSIS — R531 Weakness: Secondary | ICD-10-CM | POA: Diagnosis not present

## 2020-05-04 DIAGNOSIS — M259 Joint disorder, unspecified: Secondary | ICD-10-CM | POA: Diagnosis not present

## 2020-05-04 DIAGNOSIS — M25552 Pain in left hip: Secondary | ICD-10-CM | POA: Diagnosis not present

## 2020-05-04 DIAGNOSIS — M25551 Pain in right hip: Secondary | ICD-10-CM | POA: Diagnosis not present

## 2020-05-05 DIAGNOSIS — M25552 Pain in left hip: Secondary | ICD-10-CM | POA: Diagnosis not present

## 2020-05-05 DIAGNOSIS — M25551 Pain in right hip: Secondary | ICD-10-CM | POA: Diagnosis not present

## 2020-05-05 DIAGNOSIS — M259 Joint disorder, unspecified: Secondary | ICD-10-CM | POA: Diagnosis not present

## 2020-05-05 DIAGNOSIS — R531 Weakness: Secondary | ICD-10-CM | POA: Diagnosis not present

## 2020-05-10 DIAGNOSIS — R531 Weakness: Secondary | ICD-10-CM | POA: Diagnosis not present

## 2020-05-10 DIAGNOSIS — M259 Joint disorder, unspecified: Secondary | ICD-10-CM | POA: Diagnosis not present

## 2020-05-10 DIAGNOSIS — M25552 Pain in left hip: Secondary | ICD-10-CM | POA: Diagnosis not present

## 2020-05-10 DIAGNOSIS — M25551 Pain in right hip: Secondary | ICD-10-CM | POA: Diagnosis not present

## 2020-05-12 ENCOUNTER — Ambulatory Visit (INDEPENDENT_AMBULATORY_CARE_PROVIDER_SITE_OTHER): Payer: 59 | Admitting: Family Medicine

## 2020-05-12 ENCOUNTER — Other Ambulatory Visit: Payer: Self-pay

## 2020-05-12 ENCOUNTER — Encounter: Payer: Self-pay | Admitting: Family Medicine

## 2020-05-12 VITALS — BP 102/68 | Ht 59.0 in | Wt 115.0 lb

## 2020-05-12 DIAGNOSIS — M25552 Pain in left hip: Secondary | ICD-10-CM

## 2020-05-12 DIAGNOSIS — R531 Weakness: Secondary | ICD-10-CM | POA: Diagnosis not present

## 2020-05-12 DIAGNOSIS — M259 Joint disorder, unspecified: Secondary | ICD-10-CM | POA: Diagnosis not present

## 2020-05-12 DIAGNOSIS — M25551 Pain in right hip: Secondary | ICD-10-CM

## 2020-05-12 NOTE — Patient Instructions (Signed)
Your exam is very reassuring today. Add stretches for your IT band, SI joint if you're not doing these already. Continue with strengthening exercises. Activities and running as tolerated. Aleve 1-2 tabs twice a day with food only as needed also. Icing 15 minutes at a time as needed. Follow up with me as needed.

## 2020-05-12 NOTE — Progress Notes (Signed)
PCP: Parke Poisson, MD  Subjective:   HPI: Patient is a 34 y.o. female here for bilateral hip pain.  4/21: Patient reports she's had about 3 weeks of bilateral hip pain. She was running less than 20 miles a week prior to onset of this. Pain is intermittent and not always the same hip. Feels like it's anterolateral. No numbness or tingling. Did have some pain once down left leg that included left knee. No back pain. Has not been doing home exercises from prior visit.  5/19: Patient reports she has improved since last visit. Able to run without pain. Bothers her more with rest and not in a consistent location. Bothers both hips at different times. Doing home exercises including hamstring strengthening. Not needing any medications, ice, or heat. No back pain, numbness.  Past Medical History:  Diagnosis Date  . Anxiety   . Diabetes mellitus without complication ([place])    gestational with second preg  . Gestational diabetes mellitus (GDM), antepartum     Current Outpatient Medications on File Prior to Visit  Medication Sig Dispense Refill  . estradiol (VIVELLE-DOT) 0.1 MG/24HR patch PLACE 1 PATCH (0.1 MG TOTAL) ONTO THE SKIN 2 (TWO) TIMES A WEEK. 8 patch 12  . ferrous sulfate 325 (65 FE) MG tablet Take 1 tablet (325 mg total) by mouth daily with breakfast. (Patient not taking: Reported on 02/26/2018) 30 tablet 3  . FLUoxetine (PROZAC) 10 MG capsule Take 10 mg by mouth daily.    Marland Kitchen levonorgestrel (MIRENA) 20 MCG/24HR IUD 1 each by Intrauterine route once.    . Prenatal Vit-Fe Fumarate-FA (MULTIVITAMIN-PRENATAL) 27-0.8 MG TABS tablet Take 1 tablet by mouth daily at 12 noon.    . vitamin B-12 (CYANOCOBALAMIN) 1000 MCG tablet Take by mouth.     No current facility-administered medications on file prior to visit.    Past Surgical History:  Procedure Laterality Date  . CESAREAN SECTION    . CESAREAN SECTION N/A 04/16/2016   Procedure: CESAREAN SECTION;  Surgeon: Osborne Oman, MD;  Location: [facility];  Service: Obstetrics;  Laterality: N/A;  . WISDOM TOOTH EXTRACTION      No Known Allergies  Social History   Socioeconomic History  . Marital status: Married    Spouse name: Not on file  . Number of children: Not on file  . Years of education: Not on file  . Highest education level: Not on file  Occupational History  . Occupation: Med tech  Tobacco Use  . Smoking status: Never Smoker  . Smokeless tobacco: Never Used  Substance and Sexual Activity  . Alcohol use: Yes    Alcohol/week: 0.0 standard drinks  . Drug use: No  . Sexual activity: Yes    Partners: Male    Birth control/protection: I.U.D.  Other Topics Concern  . Not on file  Social History Narrative  . Not on file   Social Determinants of Health   Financial Resource Strain:   . Difficulty of Paying Living Expenses:   Food Insecurity:   . Worried About Charity fundraiser in the Last Year:   . Arboriculturist in the Last Year:   Transportation Needs:   . Film/video editor (Medical):   Marland Kitchen Lack of Transportation (Non-Medical):   Physical Activity:   . Days of Exercise per Week:   . Minutes of Exercise per Session:   Stress:   . Feeling of Stress :   Social Connections:   . Frequency of Communication  with Friends and Family:   . Frequency of Social Gatherings with Friends and Family:   . Attends Religious Services:   . Active Member of Clubs or Organizations:   . Attends Banker Meetings:   Marland Kitchen Marital Status:   Intimate Partner Violence:   . Fear of Current or Ex-Partner:   . Emotionally Abused:   Marland Kitchen Physically Abused:   . Sexually Abused:     Family History  Problem Relation Age of Onset  . Diabetes Mother   . Hypertension Father     BP 102/68   Ht 4\' 11"  (1.499 m)   Wt 115 lb (52.2 kg)   BMI 23.23 kg/m   Review of Systems: See HPI above.     Objective:  Physical Exam:  Gen: NAD, comfortable in exam room  Bilateral hip: No  deformity. FROM with 5/5 strength including hip abduction. Minimal tenderness over proximal IT band.  No other tenderness. NVI distally. Negative logroll, fadir.  Mild limitation with faber bilaterally.   Assessment & Plan:  1. Bilateral hip pain - exam and history again reassuring.  Pain improved with running, worse at rest.  Not consistently in one location which is also reassuring.  Radiographs were normal.  Add stretches for IT band, SI joint.  Continue home exercises.  Icing, aleve if needed.  Activities as tolerated.  F/u prn.

## 2020-05-14 DIAGNOSIS — R509 Fever, unspecified: Secondary | ICD-10-CM | POA: Diagnosis not present

## 2020-05-14 DIAGNOSIS — R000 Tachycardia, unspecified: Secondary | ICD-10-CM | POA: Diagnosis not present

## 2020-05-14 DIAGNOSIS — K6389 Other specified diseases of intestine: Secondary | ICD-10-CM | POA: Diagnosis not present

## 2020-05-14 DIAGNOSIS — R1031 Right lower quadrant pain: Secondary | ICD-10-CM | POA: Diagnosis not present

## 2020-05-14 DIAGNOSIS — R188 Other ascites: Secondary | ICD-10-CM | POA: Diagnosis not present

## 2020-05-14 DIAGNOSIS — K3580 Unspecified acute appendicitis: Secondary | ICD-10-CM | POA: Diagnosis not present

## 2020-05-14 DIAGNOSIS — K35891 Other acute appendicitis without perforation, with gangrene: Secondary | ICD-10-CM | POA: Diagnosis not present

## 2020-05-14 DIAGNOSIS — Z20822 Contact with and (suspected) exposure to covid-19: Secondary | ICD-10-CM | POA: Diagnosis not present

## 2020-05-15 DIAGNOSIS — K35891 Other acute appendicitis without perforation, with gangrene: Secondary | ICD-10-CM | POA: Diagnosis not present

## 2020-05-15 DIAGNOSIS — K352 Acute appendicitis with generalized peritonitis, without abscess: Secondary | ICD-10-CM | POA: Diagnosis not present

## 2020-05-15 DIAGNOSIS — K3580 Unspecified acute appendicitis: Secondary | ICD-10-CM | POA: Diagnosis not present

## 2020-05-19 DIAGNOSIS — K3580 Unspecified acute appendicitis: Secondary | ICD-10-CM | POA: Diagnosis not present

## 2020-05-19 DIAGNOSIS — Z09 Encounter for follow-up examination after completed treatment for conditions other than malignant neoplasm: Secondary | ICD-10-CM | POA: Diagnosis not present

## 2020-05-20 DIAGNOSIS — R000 Tachycardia, unspecified: Secondary | ICD-10-CM | POA: Diagnosis not present

## 2020-05-26 ENCOUNTER — Emergency Department (HOSPITAL_BASED_OUTPATIENT_CLINIC_OR_DEPARTMENT_OTHER): Payer: 59

## 2020-05-26 ENCOUNTER — Emergency Department (HOSPITAL_BASED_OUTPATIENT_CLINIC_OR_DEPARTMENT_OTHER)
Admission: EM | Admit: 2020-05-26 | Discharge: 2020-05-26 | Disposition: A | Discharge status: Home / Self Care | Payer: 59 | Attending: Emergency Medicine | Admitting: Emergency Medicine

## 2020-05-26 ENCOUNTER — Encounter (HOSPITAL_BASED_OUTPATIENT_CLINIC_OR_DEPARTMENT_OTHER): Payer: Self-pay | Admitting: Emergency Medicine

## 2020-05-26 ENCOUNTER — Other Ambulatory Visit: Payer: Self-pay

## 2020-05-26 DIAGNOSIS — R109 Unspecified abdominal pain: Secondary | ICD-10-CM | POA: Diagnosis not present

## 2020-05-26 DIAGNOSIS — R112 Nausea with vomiting, unspecified: Secondary | ICD-10-CM | POA: Diagnosis not present

## 2020-05-26 DIAGNOSIS — R102 Pelvic and perineal pain: Secondary | ICD-10-CM | POA: Diagnosis not present

## 2020-05-26 DIAGNOSIS — R1111 Vomiting without nausea: Secondary | ICD-10-CM

## 2020-05-26 DIAGNOSIS — E119 Type 2 diabetes mellitus without complications: Secondary | ICD-10-CM | POA: Insufficient documentation

## 2020-05-26 DIAGNOSIS — R1032 Left lower quadrant pain: Secondary | ICD-10-CM | POA: Diagnosis present

## 2020-05-26 DIAGNOSIS — R1031 Right lower quadrant pain: Secondary | ICD-10-CM | POA: Diagnosis not present

## 2020-05-26 LAB — CBC WITH DIFFERENTIAL/PLATELET
Abs Immature Granulocytes: 0.08 10*3/uL — ABNORMAL HIGH (ref 0.00–0.07)
Basophils Absolute: 0.1 10*3/uL (ref 0.0–0.1)
Basophils Relative: 1 %
Eosinophils Absolute: 0.2 10*3/uL (ref 0.0–0.5)
Eosinophils Relative: 2 %
HCT: 40.9 % (ref 36.0–46.0)
Hemoglobin: 13.6 g/dL (ref 12.0–15.0)
Immature Granulocytes: 1 %
Lymphocytes Relative: 36 %
Lymphs Abs: 3.6 10*3/uL (ref 0.7–4.0)
MCH: 25.5 pg — ABNORMAL LOW (ref 26.0–34.0)
MCHC: 33.3 g/dL (ref 30.0–36.0)
MCV: 76.6 fL — ABNORMAL LOW (ref 80.0–100.0)
Monocytes Absolute: 0.3 10*3/uL (ref 0.1–1.0)
Monocytes Relative: 3 %
Neutro Abs: 6 10*3/uL (ref 1.7–7.7)
Neutrophils Relative %: 57 %
Platelets: 373 10*3/uL (ref 150–400)
RBC: 5.34 MIL/uL — ABNORMAL HIGH (ref 3.87–5.11)
RDW: 13.9 % (ref 11.5–15.5)
WBC: 10.2 10*3/uL (ref 4.0–10.5)
nRBC: 0 % (ref 0.0–0.2)

## 2020-05-26 LAB — WET PREP, GENITAL
Clue Cells Wet Prep HPF POC: NONE SEEN
Sperm: NONE SEEN
Trich, Wet Prep: NONE SEEN
Yeast Wet Prep HPF POC: NONE SEEN

## 2020-05-26 LAB — URINALYSIS, MICROSCOPIC (REFLEX): WBC, UA: NONE SEEN WBC/hpf (ref 0–5)

## 2020-05-26 LAB — COMPREHENSIVE METABOLIC PANEL
ALT: 92 U/L — ABNORMAL HIGH (ref 0–44)
AST: 37 U/L (ref 15–41)
Albumin: 4.2 g/dL (ref 3.5–5.0)
Alkaline Phosphatase: 75 U/L (ref 38–126)
Anion gap: 9 (ref 5–15)
BUN: 17 mg/dL (ref 6–20)
CO2: 25 mmol/L (ref 22–32)
Calcium: 9.1 mg/dL (ref 8.9–10.3)
Chloride: 102 mmol/L (ref 98–111)
Creatinine, Ser: 0.63 mg/dL (ref 0.44–1.00)
GFR calc Af Amer: >60 mL/min (ref >60)
GFR calc non Af Amer: >60 mL/min (ref >60)
Glucose, Bld: 128 mg/dL — ABNORMAL HIGH (ref 70–99)
Potassium: 3.5 mmol/L (ref 3.5–5.1)
Sodium: 136 mmol/L (ref 135–145)
Total Bilirubin: 0.6 mg/dL (ref 0.3–1.2)
Total Protein: 7.7 g/dL (ref 6.5–8.1)

## 2020-05-26 LAB — LIPASE, BLOOD: Lipase: 30 U/L (ref 11–51)

## 2020-05-26 LAB — URINALYSIS, ROUTINE W REFLEX MICROSCOPIC
Bilirubin Urine: NEGATIVE
Glucose, UA: NEGATIVE mg/dL
Ketones, ur: NEGATIVE mg/dL
Leukocytes,Ua: NEGATIVE
Nitrite: NEGATIVE
Protein, ur: NEGATIVE mg/dL
Specific Gravity, Urine: 1.015 (ref 1.005–1.030)
pH: 6.5 (ref 5.0–8.0)

## 2020-05-26 LAB — PREGNANCY, URINE: Preg Test, Ur: NEGATIVE

## 2020-05-26 MED ORDER — PROMETHAZINE HCL 25 MG/ML IJ SOLN: 25.0000 mg | Freq: Once | INTRAMUSCULAR | Status: AC

## 2020-05-26 MED ORDER — LACTATED RINGERS IV BOLUS
1000.0000 mL | Freq: Once | INTRAVENOUS | Status: AC
  Administered 2020-05-26: 1000 mL via INTRAVENOUS

## 2020-05-26 MED ORDER — ONDANSETRON HCL 4 MG/2ML IJ SOLN
4.0000 mg | Freq: Once | INTRAMUSCULAR | Status: AC
  Administered 2020-05-26: 4 mg via INTRAVENOUS
  Filled 2020-05-26: qty 2

## 2020-05-26 MED ORDER — OXYCODONE HCL 5 MG PO TABS
5.0000 mg | ORAL_TABLET | Freq: Once | ORAL | Status: AC
  Administered 2020-05-26: 5 mg via ORAL
  Filled 2020-05-26: qty 1

## 2020-05-26 MED ORDER — IOHEXOL 300 MG/ML  SOLN
100.0000 mL | Freq: Once | INTRAMUSCULAR | Status: AC | PRN
  Administered 2020-05-26: 100 mL via INTRAVENOUS

## 2020-05-26 MED ORDER — ONDANSETRON HCL 4 MG PO TABS: 4.0000 mg | ORAL_TABLET | Freq: Four times a day (QID) | ORAL | 0 refills | Status: DC

## 2020-05-26 MED ORDER — ACETAMINOPHEN 500 MG PO TABS
1000.0000 mg | ORAL_TABLET | Freq: Once | ORAL | Status: AC
  Administered 2020-05-26: 1000 mg via ORAL
  Filled 2020-05-26: qty 2

## 2020-05-26 MED ORDER — PROMETHAZINE HCL 25 MG/ML IJ SOLN
INTRAMUSCULAR | Status: AC
  Administered 2020-05-26: 25 mg via INTRAVENOUS
  Filled 2020-05-26: qty 1

## 2020-05-26 MED ORDER — FENTANYL CITRATE (PF) 100 MCG/2ML IJ SOLN
50.0000 ug | Freq: Once | INTRAMUSCULAR | Status: AC
  Administered 2020-05-26: 50 ug via INTRAVENOUS
  Filled 2020-05-26: qty 2

## 2020-05-26 MED ORDER — HYDROMORPHONE HCL 1 MG/ML IJ SOLN
0.5000 mg | Freq: Once | INTRAMUSCULAR | Status: AC
  Administered 2020-05-26: 0.5 mg via INTRAVENOUS
  Filled 2020-05-26: qty 1

## 2020-05-26 MED FILL — ONDANSETRON HCL 4 MG TABLET: 4 | 3 days supply | Qty: 12 | Fill #0

## 2020-05-26 NOTE — ED Triage Notes (Signed)
Pt c/o left sided abd pain radiating to back. Pt is s/p appendectomy 10 days ago

## 2020-05-26 NOTE — ED Provider Notes (Signed)
Emergency Department Provider Note  I have reviewed the triage vital signs and the nursing notes.  HISTORY  Chief Complaint Abdominal Pain   HPI Paige Gamble is a 34 y.o. female who presents the emergency department today with acute onset of left lower quadrant abdominal pain.  Patient states this started while at work approximately midnight or 1:00 in the morning.  She states that she recently had appendectomy about 10 days ago and this feels like a similar level of pain but it on the left side in a different quality of pain.  No urinary symptoms.  She has had some pretty significant nausea but no vomiting.  She had some intermittent constipation over the last few days but has been having bowel movements recently.  She says she has been taking Tylenol and ibuprofen which may be masking if she were to have a fever but she has not felt fever or chills.  No rashes.  States that she does have some bloody vaginal discharge that is new for her.  She has an IUD and menstrual cycles are irregular.   No other associated or modifying symptoms.    Past Medical History:  Diagnosis Date  . Anxiety   . Diabetes mellitus without complication ([facility])    gestational with second preg  . Gestational diabetes mellitus (GDM), antepartum     Patient Active Problem List   Diagnosis Date Noted  . Bilateral leg pain 04/12/2018  . Error, refractive, myopia 09/17/2013  . 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 H/O anxiety state 12/13/2011  . History of recurrent urinary tract infection 12/13/2011    Past Surgical History:  Procedure Laterality Date  . CESAREAN SECTION    . CESAREAN SECTION N/A 04/16/2016   Procedure: CESAREAN SECTION;  Surgeon: Tereso Newcomer, MD;  Location: [facility];  Service: Obstetrics;  Laterality: N/A;  . WISDOM TOOTH EXTRACTION      Current Outpatient Rx  . Order #: [id number] Class: Normal  . Order #: [id number] Class:  Normal  . Order #: [id number] Class: Historical Med  . Order #: [id number] Class: Historical Med  . Order #: [id number] Class: Historical Med  . Order #: [id number] Class: Historical Med    Allergies Patient has no known allergies.  Family History  Problem Relation Age of Onset  . Diabetes Mother   . Hypertension Father     Social History Social History   Tobacco Use  . Smoking status: Never Smoker  . Smokeless tobacco: Never Used  Substance Use Topics  . Alcohol use: Yes    Alcohol/week: 0.0 standard drinks  . Drug use: No    Review of Systems  All other systems negative except as documented in the HPI. All pertinent positives and negatives as reviewed in the HPI. ____________________________________________  PHYSICAL EXAM:  VITAL SIGNS: ED Triage Vitals  Enc Vitals Group     BP 05/26/20 0403 117/74     Pulse Rate 05/26/20 0403 90     Resp 05/26/20 0403 14     Temp 05/26/20 0403 98.3 F (36.8 C)     Temp Source 05/26/20 0403 Oral     SpO2 05/26/20 0403 100 %     Weight 05/26/20 0404 115 lb 1.3 oz (52.2 kg)     Height 05/26/20 0404 4\' 11"  (1.499 m)    Constitutional: Alert and oriented. Well appearing and in no acute distress. Eyes: Conjunctivae are normal. PERRL. EOMI. Head: Atraumatic. Nose: No congestion/rhinnorhea. Mouth/Throat: Mucous membranes are  moist.  Oropharynx non-erythematous. Neck: No stridor.  No meningeal signs.   Cardiovascular: Normal rate, regular rhythm. Good peripheral circulation. Grossly normal heart sounds.   Respiratory: Normal respiratory effort.  No retractions. Lungs CTAB. Gastrointestinal: Soft and nontender. No distention.  Musculoskeletal: No lower extremity tenderness nor edema. No gross deformities of extremities. Neurologic:  Normal speech and language. No gross focal neurologic deficits are appreciated.  GU: thick bloody cervical discharge without CMT, adnexal fullness or cervical friability Skin:  Skin is warm, dry and intact.  No rash noted.  ____________________________________________   LABS (all labs ordered are listed, but only abnormal results are displayed)  Labs Reviewed  CBC WITH DIFFERENTIAL/PLATELET - Abnormal; Notable for the following components:      Result Value   RBC 5.34 (*)    MCV 76.6 (*)    MCH 25.5 (*)    Abs Immature Granulocytes 0.08 (*)    All other components within normal limits  COMPREHENSIVE METABOLIC PANEL - Abnormal; Notable for the following components:   Glucose, Bld 128 (*)    ALT 92 (*)    All other components within normal limits  URINALYSIS, ROUTINE W REFLEX MICROSCOPIC - Abnormal; Notable for the following components:   APPearance HAZY (*)    Hgb urine dipstick TRACE (*)    All other components within normal limits  URINALYSIS, MICROSCOPIC (REFLEX) - Abnormal; Notable for the following components:   Bacteria, UA FEW (*)    All other components within normal limits  LIPASE, BLOOD  PREGNANCY, URINE  WET PREP  (BD AFFIRM) ([place])  GC/CHLAMYDIA PROBE AMP ([place]) NOT AT [facility]   ____________________________________________  RADIOLOGY  CT ABDOMEN PELVIS W CONTRAST  Result Date: 05/26/2020 CLINICAL DATA:  Status post appendectomy, abdominal pain EXAM: CT ABDOMEN AND PELVIS WITH CONTRAST TECHNIQUE: Multidetector CT imaging of the abdomen and pelvis was performed using the standard protocol following bolus administration of intravenous contrast. CONTRAST:  OMNIPAQUE IOHEXOL 300 MG/ML  SOLN COMPARISON:  None. FINDINGS: Lower chest: The visualized heart size within normal limits. No pericardial fluid/thickening. No hiatal hernia. The visualized portions of the lungs are clear. Hepatobiliary: The liver is normal in density without focal abnormality.The main portal vein is patent. No evidence of calcified gallstones, gallbladder wall thickening or biliary dilatation. Pancreas: Unremarkable. No pancreatic ductal dilatation or surrounding inflammatory changes.  Spleen: Normal in size without focal abnormality. Adrenals/Urinary Tract: Both adrenal glands appear normal. The kidneys and collecting system appear normal without evidence of urinary tract calculus or hydronephrosis. Bladder is unremarkable. Stomach/Bowel: The stomach is normal in appearance. The proximal small bowel is unremarkable. The patient is status post appendectomy with postsurgical changes in the right lower quadrant. No loculated fluid collections or free air. There appears to be mild Vascular/Lymphatic: There are no enlarged mesenteric, retroperitoneal, or pelvic lymph nodes. No significant vascular findings are present. Reproductive: The uterus and adnexa are unremarkable. There is a IUD seen within the endometrial canal. Small amount of physiologic free fluid in the cul-de-sac. Other: No evidence of abdominal wall mass or hernia. Musculoskeletal: No acute or significant osseous findings. IMPRESSION: Postsurgical changes in the right lower quadrant. No loculated fluid collections or free air. Electronically Signed   By: Jonna Clark M.D.   On: 05/26/2020 06:24   ____________________________________________  PROCEDURES  Procedure(s) performed:   Pelvic exam  Date/Time: 05/26/2020 7:06 AM Performed by: Marily Memos, MD Authorized by: Marily Memos, MD  Consent: Verbal consent obtained. Risks and benefits: risks, benefits and  alternatives were discussed Consent given by: patient Patient understanding: patient states understanding of the procedure being performed Patient consent: the patient's understanding of the procedure matches consent given Relevant documents: relevant documents present and verified Imaging studies: imaging studies available Required items: required blood products, implants, devices, and special equipment available Time out: Immediately prior to procedure a "time out" was called to verify the correct patient, procedure, equipment, support staff and site/side marked  as required. Preparation: Patient was prepped and draped in the usual sterile fashion. Local anesthesia used: no  Anesthesia: Local anesthesia used: no  Sedation: Patient sedated: no  Patient tolerance: patient tolerated the procedure well with no immediate complications Comments: Chaperoned by NT Marcie Bal    ____________________________________________  INITIAL IMPRESSION / ASSESSMENT AND PLAN / ED COURSE   This patient presents to the ED for concern of abdominal pain, this involves an extensive number of treatment options, and is a complaint that carries with it a high risk of complications and morbidity.  The differential diagnosis includes diverticulitis, post surgical complications such as abscess or infection, peritonitis, PID, ovarian torsion.     Lab Tests:   I Ordered, reviewed, and interpreted labs, which included CBC, CMP, lipase, urinalysis, urine pregnancy all of which were unremarkable.  Medicines ordered:   I ordered medication Dilaudid and fentanyl, Phenergan and Zofran for pain and nausea  Imaging Studies ordered:   I independently visualized and interpreted imaging CT abdomen pelvis which showed no significant abnormalities.  Additional history obtained:   Additional history obtained from no one  Previous records obtained and reviewed in epic  Care transferred pending pelvic ultrasound and follow-up on wet prep.  At this time is unclear what the causes of her symptoms are.  Could be endometritis from an IUD that migrated.  Could be STD although less likely. ____________________________________________  FINAL CLINICAL IMPRESSION(S) / ED DIAGNOSES  Final diagnoses:  Pelvic pain    MEDICATIONS GIVEN DURING THIS VISIT:  Medications  HYDROmorphone (DILAUDID) injection 0.5 mg (has no administration in time range)  lactated ringers bolus 1,000 mL ( Intravenous Stopped 05/26/20 0603)  fentaNYL (SUBLIMAZE) injection 50 mcg (50 mcg Intravenous Given  05/26/20 0423)  ondansetron (ZOFRAN) injection 4 mg (4 mg Intravenous Given 05/26/20 0422)  HYDROmorphone (DILAUDID) injection 0.5 mg (0.5 mg Intravenous Given 05/26/20 0517)  promethazine (PHENERGAN) injection 25 mg (25 mg Intravenous Given 05/26/20 0516)  iohexol (OMNIPAQUE) 300 MG/ML solution 100 mL (100 mLs Intravenous Contrast Given 05/26/20 0608)    NEW OUTPATIENT MEDICATIONS STARTED DURING THIS VISIT:  New Prescriptions   No medications on file    Note:  This note was prepared with assistance of Dragon voice recognition software. Occasional wrong-word or sound-a-like substitutions may have occurred due to the inherent limitations of voice recognition software.   Cairo Lingenfelter, Corene Cornea, MD 05/26/20 [phone]

## 2020-05-26 NOTE — ED Notes (Signed)
Pt is aware she needs a urine sample 

## 2020-05-26 NOTE — ED Provider Notes (Signed)
Patient signed out to me at 7 AM.  Here with left lower quadrant abdominal pain.  Started about 7 hours ago.  Recent appendectomy 10 days ago.  Lab work thus far is unremarkable.  CT scan abdomen and pelvis showed no acute infectious process.  Postsurgical site well-appearing.  Patient has IUD.  Has required several rounds of IV pain medications.  Pelvic exam performed showed scant blood in the vaginal vault but no cervical motion tenderness.  Patient not concerned about STDs.  Pregnancy test negative.  Awaiting GC/chlamydia, wet prep, pelvic ultrasound to further evaluate for ovarian cyst/torsion.  9 am, on reevaluation patient is feeling better.  Awaiting ultrasound.  No further episodes of emesis.  Abdominal exam is overall benign.  Will give Tylenol per request the patient.  Pelvic ultrasound is unremarkable.  Patient was able to ambulate and use the bathroom without any issues.  Will prescribe Zofran.  Recommend follow-up with primary care doctor.  Follow-up with surgery is in place.  Understands return precautions.  This chart was dictated using voice recognition software.  Despite best efforts to proofread,  errors can occur which can change the documentation meaning.      Virgina Norfolk, DO 05/26/20 1133

## 2020-05-27 LAB — GC/CHLAMYDIA PROBE AMP (~~LOC~~) NOT AT ARMC
Chlamydia: NEGATIVE
Comment: NEGATIVE
Comment: NORMAL
Neisseria Gonorrhea: NEGATIVE

## 2020-06-01 DIAGNOSIS — M25551 Pain in right hip: Secondary | ICD-10-CM | POA: Diagnosis not present

## 2020-06-01 DIAGNOSIS — M259 Joint disorder, unspecified: Secondary | ICD-10-CM | POA: Diagnosis not present

## 2020-06-01 DIAGNOSIS — R531 Weakness: Secondary | ICD-10-CM | POA: Diagnosis not present

## 2020-06-01 DIAGNOSIS — M25552 Pain in left hip: Secondary | ICD-10-CM | POA: Diagnosis not present

## 2020-06-14 DIAGNOSIS — M25551 Pain in right hip: Secondary | ICD-10-CM | POA: Diagnosis not present

## 2020-06-14 DIAGNOSIS — M25552 Pain in left hip: Secondary | ICD-10-CM | POA: Diagnosis not present

## 2020-06-14 DIAGNOSIS — M259 Joint disorder, unspecified: Secondary | ICD-10-CM | POA: Diagnosis not present

## 2020-06-14 DIAGNOSIS — R531 Weakness: Secondary | ICD-10-CM | POA: Diagnosis not present

## 2020-06-23 DIAGNOSIS — M25551 Pain in right hip: Secondary | ICD-10-CM | POA: Diagnosis not present

## 2020-06-23 DIAGNOSIS — M25552 Pain in left hip: Secondary | ICD-10-CM | POA: Diagnosis not present

## 2020-06-23 DIAGNOSIS — R531 Weakness: Secondary | ICD-10-CM | POA: Diagnosis not present

## 2020-06-23 DIAGNOSIS — M259 Joint disorder, unspecified: Secondary | ICD-10-CM | POA: Diagnosis not present

## 2020-07-05 MED FILL — DOTTI 0.1 MG/24HR PTTW: 0.1 | 28 days supply | Qty: 8 | Fill #8

## 2020-07-30 MED FILL — DOTTI 0.1 MG/24HR PTTW: 0.1 | 28 days supply | Qty: 8 | Fill #9

## 2020-08-31 MED FILL — DOTTI 0.1 MG/24HR PTTW: 0.1 | 28 days supply | Qty: 8 | Fill #10

## 2020-09-23 MED FILL — DOTTI 0.1 MG/24HR PTTW: 0.1 | 28 days supply | Qty: 8 | Fill #11

## 2020-10-12 DIAGNOSIS — M25571 Pain in right ankle and joints of right foot: Secondary | ICD-10-CM | POA: Diagnosis not present

## 2020-10-12 DIAGNOSIS — S93491A Sprain of other ligament of right ankle, initial encounter: Secondary | ICD-10-CM | POA: Diagnosis not present

## 2020-10-13 ENCOUNTER — Other Ambulatory Visit (HOSPITAL_BASED_OUTPATIENT_CLINIC_OR_DEPARTMENT_OTHER): Payer: Self-pay | Admitting: Internal Medicine

## 2020-10-13 MED FILL — FLUARIX QUADRIVALENT 0.5 ML: 0.5 | 1 days supply | Qty: 1 | Fill #0

## 2020-10-21 MED FILL — DOTTI 0.1 MG/24HR PTTW: 0.1 | 28 days supply | Qty: 8 | Fill #12

## 2020-11-22 ENCOUNTER — Other Ambulatory Visit (HOSPITAL_BASED_OUTPATIENT_CLINIC_OR_DEPARTMENT_OTHER): Payer: Self-pay | Admitting: Physician Assistant

## 2020-11-22 MED FILL — DOTTI 0.1 MG/24HR PTTW: 0.1 | 28 days supply | Qty: 8 | Fill #0

## 2020-11-23 ENCOUNTER — Other Ambulatory Visit (HOSPITAL_BASED_OUTPATIENT_CLINIC_OR_DEPARTMENT_OTHER): Payer: Self-pay | Admitting: Internal Medicine

## 2020-11-23 ENCOUNTER — Ambulatory Visit: Payer: 59 | Attending: Internal Medicine

## 2020-11-23 DIAGNOSIS — Z23 Encounter for immunization: Secondary | ICD-10-CM

## 2020-11-23 MED FILL — PFIZER-BIONTECH COVID-19 VA: 30 | 1 days supply | Qty: 0 | Fill #0

## 2020-11-23 NOTE — Progress Notes (Signed)
   Covid-19 Vaccination Clinic  Name:  Zurii Hewes    MRN: [id number] DOB: Jul 10, 1986  11/23/2020  Ms. Camino was observed post Covid-19 immunization for 15 minutes without incident. She was provided with Vaccine Information Sheet and instruction to access the V-Safe system.   Ms. Goren was instructed to call 911 with any severe reactions post vaccine: Marland Kitchen Difficulty breathing  . Swelling of face and throat  . A fast heartbeat  . A bad rash all over body  . Dizziness and weakness   Immunizations Administered    Name Date Dose VIS Date Route   Pfizer COVID-19 Vaccine 11/23/2020  3:04 PM 0.3 mL 10/13/2020 Intramuscular   Manufacturer: [employer], [employer]   Lot: [id number]   NDC: [address]-8

## 2020-12-14 MED FILL — DOTTI 0.1 MG/24HR PTTW: 0.1 | 28 days supply | Qty: 8 | Fill #1

## 2021-01-18 DIAGNOSIS — E2839 Other primary ovarian failure: Secondary | ICD-10-CM | POA: Diagnosis not present

## 2021-01-18 DIAGNOSIS — Z01419 Encounter for gynecological examination (general) (routine) without abnormal findings: Secondary | ICD-10-CM | POA: Diagnosis not present

## 2021-01-20 MED FILL — DOTTI 0.1 MG/24HR PTTW: 0.1 | 28 days supply | Qty: 8 | Fill #2

## 2021-02-14 MED FILL — DOTTI 0.1 MG/24HR PTTW: 0.1 | 28 days supply | Qty: 8 | Fill #0

## 2021-02-23 DIAGNOSIS — E28310 Symptomatic premature menopause: Secondary | ICD-10-CM | POA: Diagnosis not present

## 2021-02-23 DIAGNOSIS — F419 Anxiety disorder, unspecified: Secondary | ICD-10-CM | POA: Diagnosis not present

## 2021-02-23 DIAGNOSIS — Z0000 Encounter for general adult medical examination without abnormal findings: Secondary | ICD-10-CM | POA: Diagnosis not present

## 2021-02-23 DIAGNOSIS — R150 Incomplete defecation: Secondary | ICD-10-CM | POA: Diagnosis not present

## 2021-02-23 DIAGNOSIS — M25571 Pain in right ankle and joints of right foot: Secondary | ICD-10-CM | POA: Diagnosis not present

## 2021-02-23 DIAGNOSIS — E538 Deficiency of other specified B group vitamins: Secondary | ICD-10-CM | POA: Diagnosis not present

## 2021-03-11 ENCOUNTER — Other Ambulatory Visit (HOSPITAL_BASED_OUTPATIENT_CLINIC_OR_DEPARTMENT_OTHER): Payer: Self-pay

## 2021-03-11 MED ORDER — ESTRADIOL 0.1 MG/24HR TD PTTW
1.0000 | MEDICATED_PATCH | TRANSDERMAL | 9 refills | Status: DC
  Filled 2021-03-11: qty 8, 28d supply, fill #0
  Filled 2021-04-11: qty 8, 28d supply, fill #1
  Filled 2021-05-10: qty 8, 28d supply, fill #2
  Filled 2021-06-13: qty 8, 28d supply, fill #3
  Filled 2021-07-05: qty 8, 28d supply, fill #4
  Filled 2021-08-07: qty 8, 28d supply, fill #5
  Filled 2021-09-06: qty 8, 28d supply, fill #6
  Filled 2021-10-03: qty 8, 28d supply, fill #7
  Filled 2021-10-29: qty 8, 28d supply, fill #8
  Filled 2021-11-26: qty 8, 28d supply, fill #9

## 2021-03-24 ENCOUNTER — Other Ambulatory Visit (HOSPITAL_BASED_OUTPATIENT_CLINIC_OR_DEPARTMENT_OTHER): Payer: Self-pay

## 2021-03-29 ENCOUNTER — Other Ambulatory Visit (HOSPITAL_BASED_OUTPATIENT_CLINIC_OR_DEPARTMENT_OTHER): Payer: Self-pay

## 2021-03-29 DIAGNOSIS — R5383 Other fatigue: Secondary | ICD-10-CM | POA: Diagnosis not present

## 2021-03-29 DIAGNOSIS — R151 Fecal smearing: Secondary | ICD-10-CM | POA: Diagnosis not present

## 2021-04-04 ENCOUNTER — Other Ambulatory Visit (HOSPITAL_BASED_OUTPATIENT_CLINIC_OR_DEPARTMENT_OTHER): Payer: Self-pay

## 2021-04-11 DIAGNOSIS — R151 Fecal smearing: Secondary | ICD-10-CM | POA: Diagnosis not present

## 2021-04-11 DIAGNOSIS — R159 Full incontinence of feces: Secondary | ICD-10-CM | POA: Diagnosis not present

## 2021-04-12 ENCOUNTER — Other Ambulatory Visit (HOSPITAL_BASED_OUTPATIENT_CLINIC_OR_DEPARTMENT_OTHER): Payer: Self-pay

## 2021-05-03 ENCOUNTER — Other Ambulatory Visit (HOSPITAL_BASED_OUTPATIENT_CLINIC_OR_DEPARTMENT_OTHER): Payer: Self-pay

## 2021-05-03 MED ORDER — FLUOXETINE HCL 10 MG PO CAPS
10.0000 mg | ORAL_CAPSULE | Freq: Every day | ORAL | 1 refills | Status: DC
  Filled 2021-05-03: qty 90, 90d supply, fill #0
  Filled 2021-08-07: qty 90, 90d supply, fill #1

## 2021-05-11 ENCOUNTER — Other Ambulatory Visit (HOSPITAL_BASED_OUTPATIENT_CLINIC_OR_DEPARTMENT_OTHER): Payer: Self-pay

## 2021-06-13 ENCOUNTER — Other Ambulatory Visit (HOSPITAL_BASED_OUTPATIENT_CLINIC_OR_DEPARTMENT_OTHER): Payer: Self-pay

## 2021-07-06 ENCOUNTER — Other Ambulatory Visit (HOSPITAL_BASED_OUTPATIENT_CLINIC_OR_DEPARTMENT_OTHER): Payer: Self-pay

## 2021-07-18 ENCOUNTER — Telehealth: Payer: 59 | Admitting: Family

## 2021-07-18 ENCOUNTER — Other Ambulatory Visit (HOSPITAL_BASED_OUTPATIENT_CLINIC_OR_DEPARTMENT_OTHER): Payer: Self-pay

## 2021-07-18 DIAGNOSIS — Z20818 Contact with and (suspected) exposure to other bacterial communicable diseases: Secondary | ICD-10-CM | POA: Diagnosis not present

## 2021-07-18 DIAGNOSIS — J029 Acute pharyngitis, unspecified: Secondary | ICD-10-CM

## 2021-07-18 MED ORDER — AMOXICILLIN 500 MG PO CAPS
500.0000 mg | ORAL_CAPSULE | Freq: Two times a day (BID) | ORAL | 0 refills | Status: AC
  Filled 2021-07-18: qty 20, 10d supply, fill #0

## 2021-07-18 NOTE — Progress Notes (Signed)

## 2021-07-26 ENCOUNTER — Other Ambulatory Visit (HOSPITAL_BASED_OUTPATIENT_CLINIC_OR_DEPARTMENT_OTHER): Payer: Self-pay

## 2021-07-26 DIAGNOSIS — R059 Cough, unspecified: Secondary | ICD-10-CM | POA: Diagnosis not present

## 2021-07-26 DIAGNOSIS — F419 Anxiety disorder, unspecified: Secondary | ICD-10-CM | POA: Diagnosis not present

## 2021-07-26 DIAGNOSIS — U099 Post covid-19 condition, unspecified: Secondary | ICD-10-CM | POA: Diagnosis not present

## 2021-07-26 MED ORDER — COVID-19 AT HOME ANTIGEN TEST VI KIT
PACK | 0 refills | Status: DC
  Filled 2021-07-26: qty 4, 8d supply, fill #0

## 2021-07-26 MED ORDER — HYDROXYZINE HCL 25 MG PO TABS
25.0000 mg | ORAL_TABLET | Freq: Three times a day (TID) | ORAL | 0 refills | Status: DC | PRN
  Filled 2021-07-26: qty 30, 10d supply, fill #0

## 2021-08-08 ENCOUNTER — Other Ambulatory Visit (HOSPITAL_BASED_OUTPATIENT_CLINIC_OR_DEPARTMENT_OTHER): Payer: Self-pay

## 2021-08-10 ENCOUNTER — Other Ambulatory Visit (HOSPITAL_BASED_OUTPATIENT_CLINIC_OR_DEPARTMENT_OTHER): Payer: Self-pay

## 2021-09-07 ENCOUNTER — Other Ambulatory Visit (HOSPITAL_BASED_OUTPATIENT_CLINIC_OR_DEPARTMENT_OTHER): Payer: Self-pay

## 2021-10-03 ENCOUNTER — Other Ambulatory Visit (HOSPITAL_BASED_OUTPATIENT_CLINIC_OR_DEPARTMENT_OTHER): Payer: Self-pay

## 2021-10-20 ENCOUNTER — Ambulatory Visit: Payer: 59 | Attending: Internal Medicine

## 2021-10-20 ENCOUNTER — Other Ambulatory Visit (HOSPITAL_BASED_OUTPATIENT_CLINIC_OR_DEPARTMENT_OTHER): Payer: Self-pay

## 2021-10-20 ENCOUNTER — Other Ambulatory Visit: Payer: Self-pay

## 2021-10-20 DIAGNOSIS — Z23 Encounter for immunization: Secondary | ICD-10-CM

## 2021-10-20 MED ORDER — PFIZER COVID-19 VAC BIVALENT 30 MCG/0.3ML IM SUSP
INTRAMUSCULAR | 0 refills | Status: DC
  Filled 2021-10-20: qty 0.3, 1d supply, fill #0

## 2021-10-20 NOTE — Progress Notes (Signed)
   Covid-19 Vaccination Clinic  Name:  Paige Gamble    MRN: [id number] DOB: 05/03/1986  10/20/2021  Paige Gamble was observed post Covid-19 immunization for 15 minutes without incident. She was provided with Vaccine Information Sheet and instruction to access the V-Safe system.   Paige Gamble was instructed to call 911 with any severe reactions post vaccine: Difficulty breathing  Swelling of face and throat  A fast heartbeat  A bad rash all over body  Dizziness and weakness   Immunizations Administered     Name Date Dose VIS Date Route   Pfizer Covid-19 Vaccine Bivalent Booster 10/20/2021  3:32 PM 0.3 mL 08/24/2021 Intramuscular   Manufacturer: [employer], [employer]   Lot: [id number]   NDC: [phone]

## 2021-10-31 ENCOUNTER — Other Ambulatory Visit (HOSPITAL_BASED_OUTPATIENT_CLINIC_OR_DEPARTMENT_OTHER): Payer: Self-pay

## 2021-11-08 ENCOUNTER — Other Ambulatory Visit (HOSPITAL_BASED_OUTPATIENT_CLINIC_OR_DEPARTMENT_OTHER): Payer: Self-pay

## 2021-11-09 ENCOUNTER — Other Ambulatory Visit (HOSPITAL_BASED_OUTPATIENT_CLINIC_OR_DEPARTMENT_OTHER): Payer: Self-pay

## 2021-11-09 MED ORDER — FLUOXETINE HCL 10 MG PO CAPS
10.0000 mg | ORAL_CAPSULE | Freq: Every day | ORAL | 1 refills | Status: DC
  Filled 2021-11-09: qty 90, 90d supply, fill #0
  Filled 2022-02-08: qty 90, 90d supply, fill #1

## 2021-11-22 IMAGING — CT CT ABD-PELV W/ CM
2 of 4 series · 16 of 46 positions shown, 18 images · IV contrast (Omnipaque)
Comparison: None.

CLINICAL DATA: Status post appendectomy, abdominal pain

EXAM:
CT ABDOMEN AND PELVIS WITH CONTRAST
TECHNIQUE: Multidetector CT imaging of the abdomen and pelvis was performed
using the standard protocol following bolus administration of
intravenous contrast.
CONTRAST:  100mL OMNIPAQUE IOHEXOL 300 MG/ML  SOLN

[Series 2: axial st · axial · 0.62mm/px · z∈[-452,-78]mm · 13 of 83 slices shown, 15 images]
[im 4/83  soft-tissue]
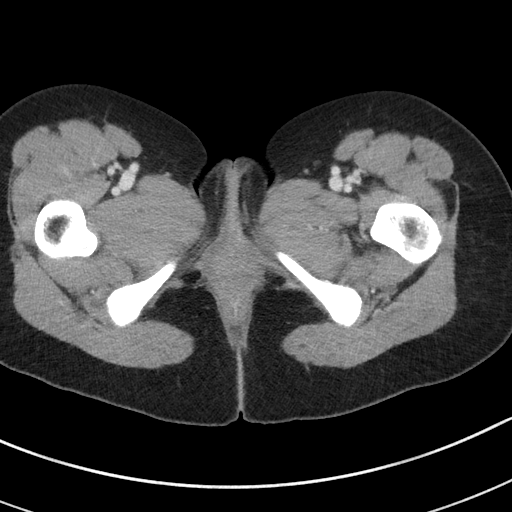
[im 4/83  bone]
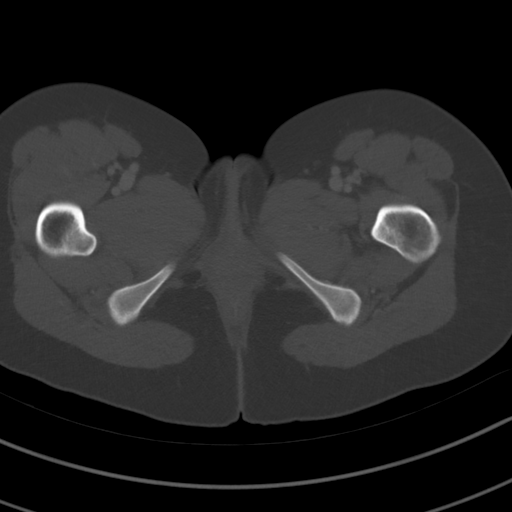
[im 10/83  soft-tissue]
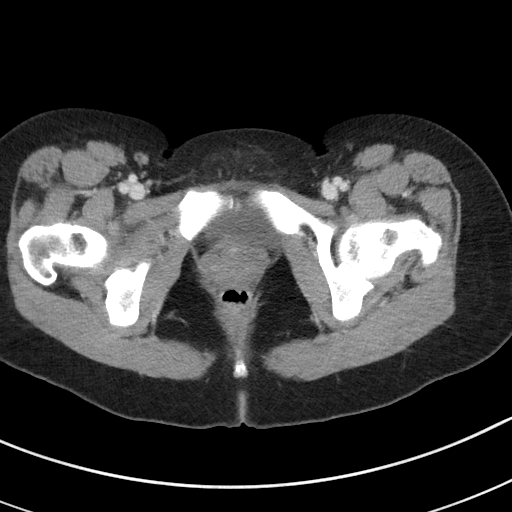
[im 17/83  soft-tissue]
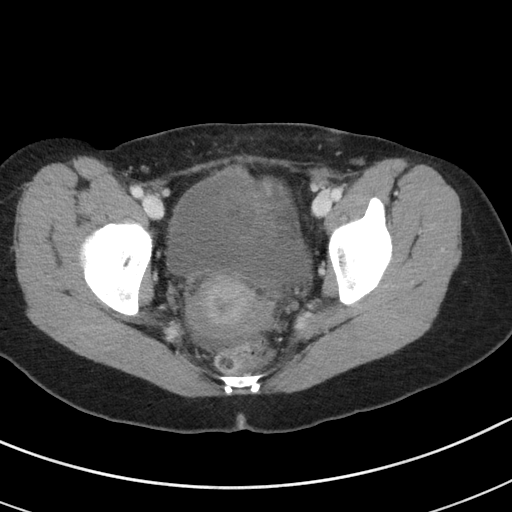
[im 23/83  soft-tissue]
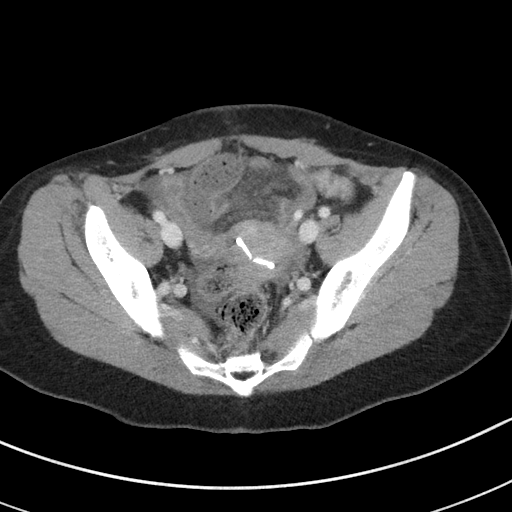
[im 30/83  soft-tissue]
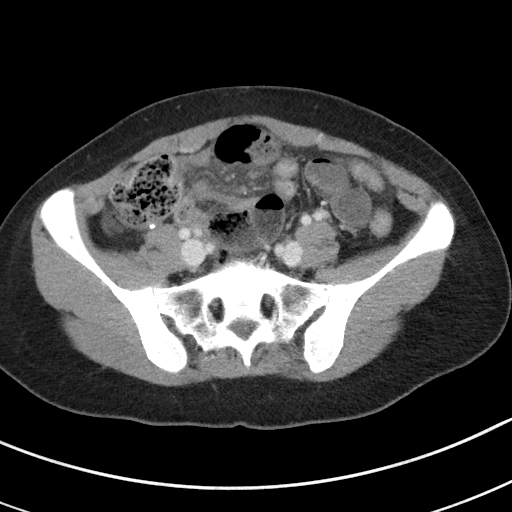
[im 37/83  soft-tissue]
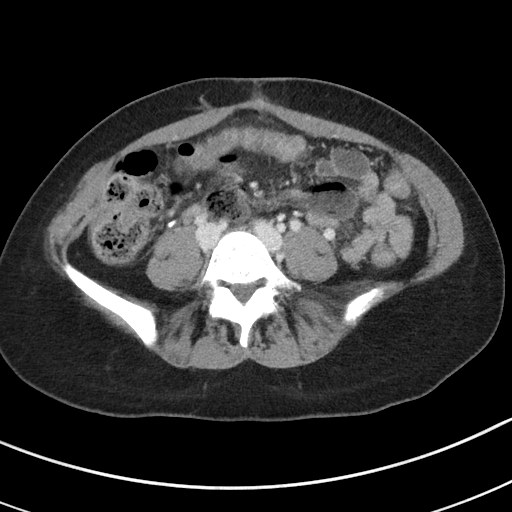
[im 43/83  soft-tissue]
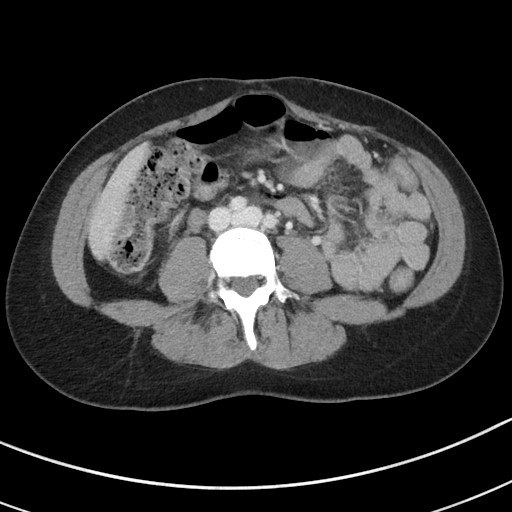
[im 46/83  soft-tissue]
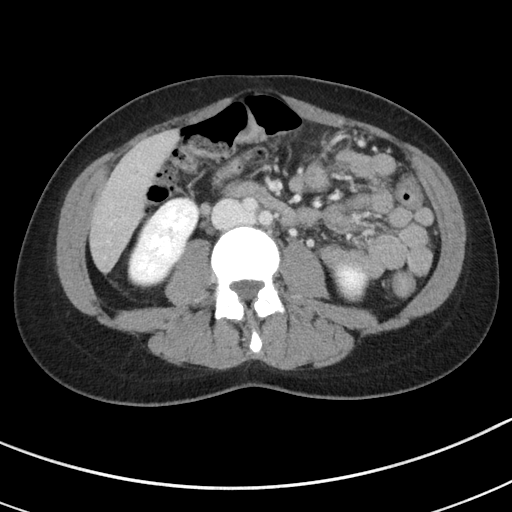
[im 53/83  soft-tissue]
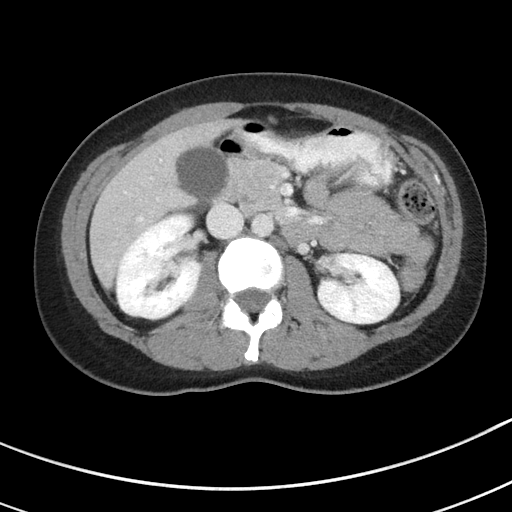
[im 53/83  bone]
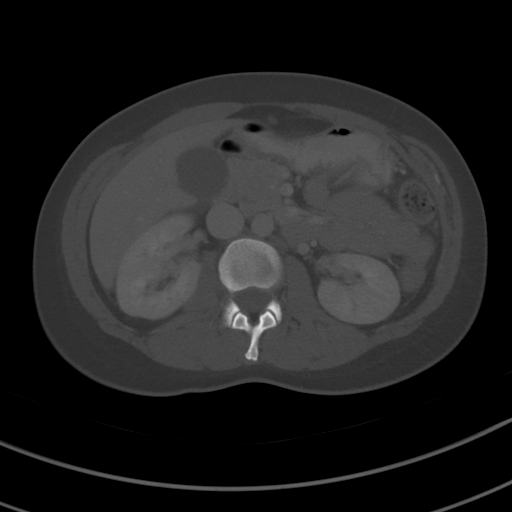
[im 60/83  soft-tissue]
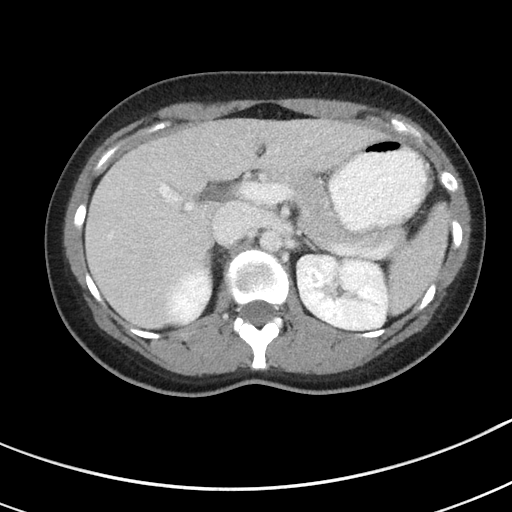
[im 66/83  soft-tissue]
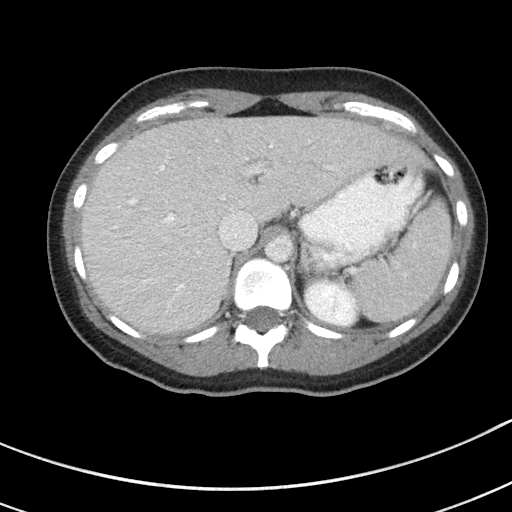
[im 73/83  soft-tissue]
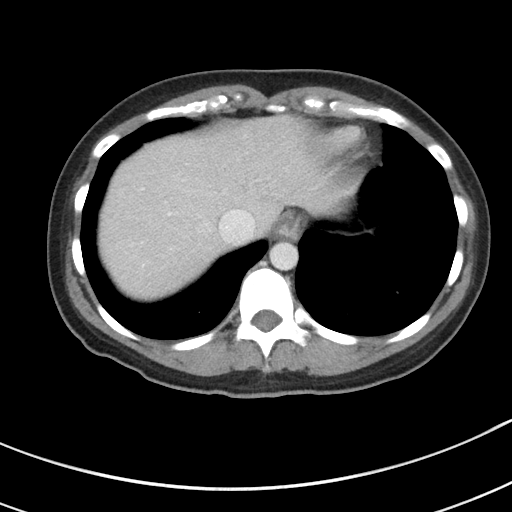
[im 79/83  soft-tissue]
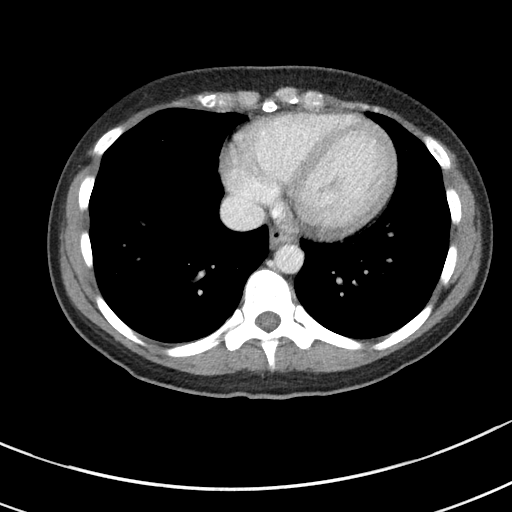

[Series 5: coronal st · coronal · 0.69mm/px · 3 of 66 slices shown]
[im 22/66  soft-tissue]
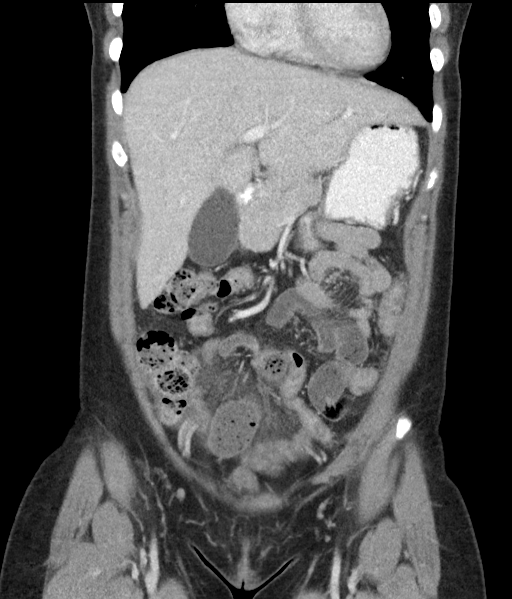
[im 29/66  soft-tissue]
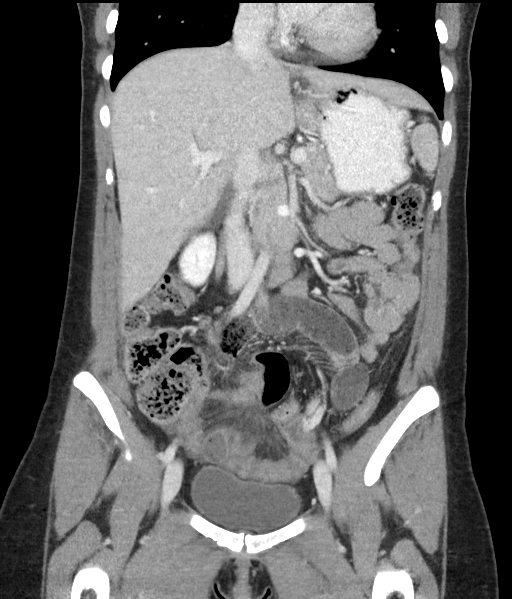
[im 37/66  soft-tissue]
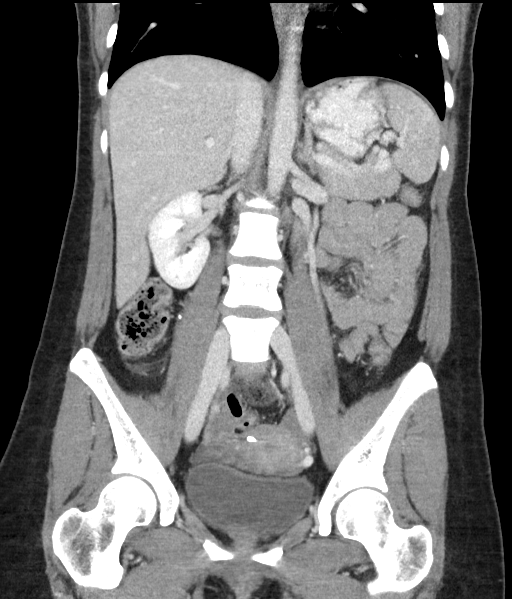

[16 of 46 positions shown; findings below may reference images not displayed]

FINDINGS: Lower chest: The visualized heart size within normal limits. No
pericardial fluid/thickening.

No hiatal hernia.

The visualized portions of the lungs are clear.

Hepatobiliary: The liver is normal in density without focal
abnormality.The main portal vein is patent. No evidence of calcified
gallstones, gallbladder wall thickening or biliary dilatation.

Pancreas: Unremarkable. No pancreatic ductal dilatation or
surrounding inflammatory changes.

Spleen: Normal in size without focal abnormality.

Adrenals/Urinary Tract: Both adrenal glands appear normal. The
kidneys and collecting system appear normal without evidence of
urinary tract calculus or hydronephrosis. Bladder is unremarkable.

Stomach/Bowel: The stomach is normal in appearance. The proximal
small bowel is unremarkable. The patient is status post appendectomy
with postsurgical changes in the right lower quadrant. No loculated
fluid collections or free air. There appears to be mild

Vascular/Lymphatic: There are no enlarged mesenteric,
retroperitoneal, or pelvic lymph nodes. No significant vascular
findings are present.

Reproductive: The uterus and adnexa are unremarkable. There is a IUD
seen within the endometrial canal. Small amount of physiologic free
fluid in the cul-de-sac.

Other: No evidence of abdominal wall mass or hernia.

Musculoskeletal: No acute or significant osseous findings.
IMPRESSION: Postsurgical changes in the right lower quadrant. No loculated fluid
collections or free air.

## 2021-11-22 IMAGING — US US PELVIS COMPLETE TRANSABD/TRANSVAG W DUPLEX
1 series · 13 of 25 positions shown · non-contrast
Comparison: 02/28/2018

Correlation: CT abdomen and pelvis 05/26/2020

CLINICAL DATA: RIGHT lower quadrant pain, has IUD, post
appendectomy 10 days ago

EXAM:
TRANSABDOMINAL AND TRANSVAGINAL ULTRASOUND OF PELVIS
DOPPLER ULTRASOUND OF OVARIES
TECHNIQUE: Both transabdominal and transvaginal ultrasound examinations of the
pelvis were performed. Transabdominal technique was performed for
global imaging of the pelvis including uterus, ovaries, adnexal
regions, and pelvic cul-de-sac.
It was necessary to proceed with endovaginal exam following the
transabdominal exam to visualize the endometrium and ovaries. Color
and duplex Doppler ultrasound was utilized to evaluate blood flow to
the ovaries.

[Series 1: us pelvis complete transabd/transvag w duplex · 13 of 40 slices shown]
[im 1/40]
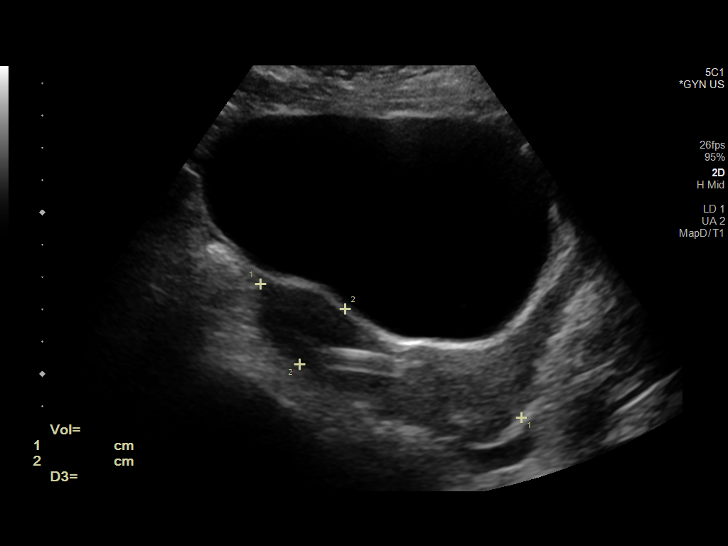
[im 4/40]
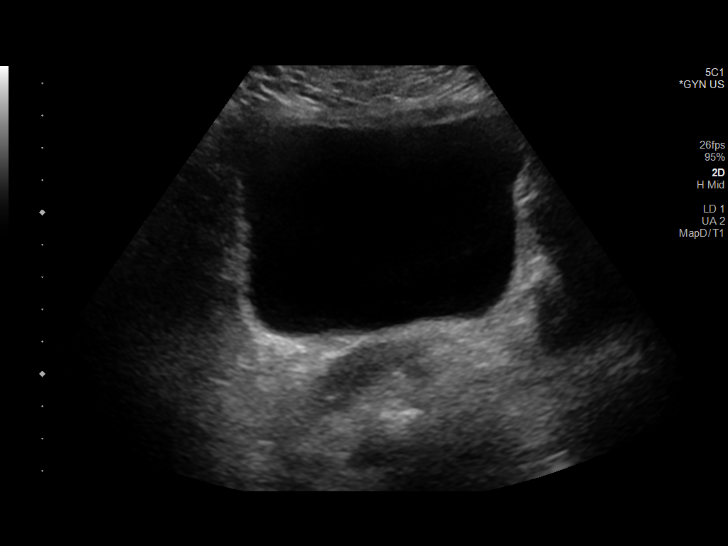
[im 7/40]
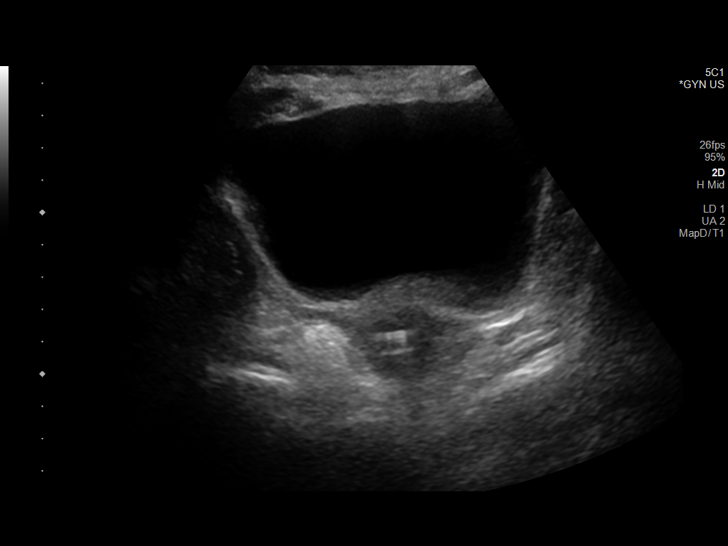
[im 10/40]
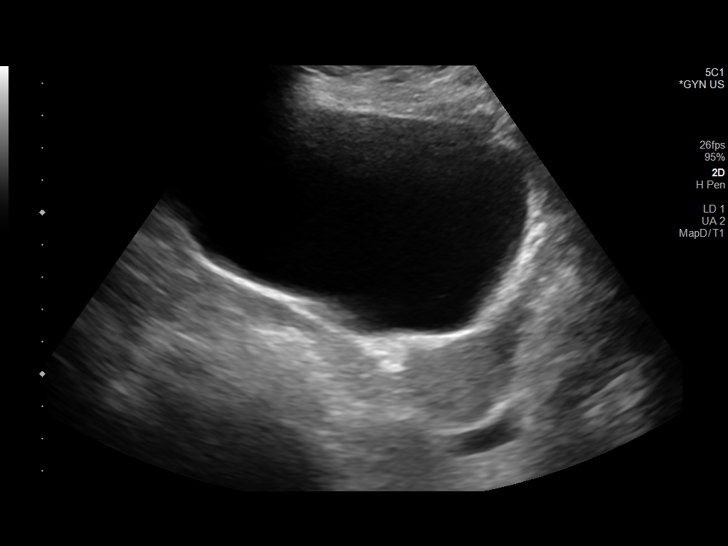
[im 14/40]
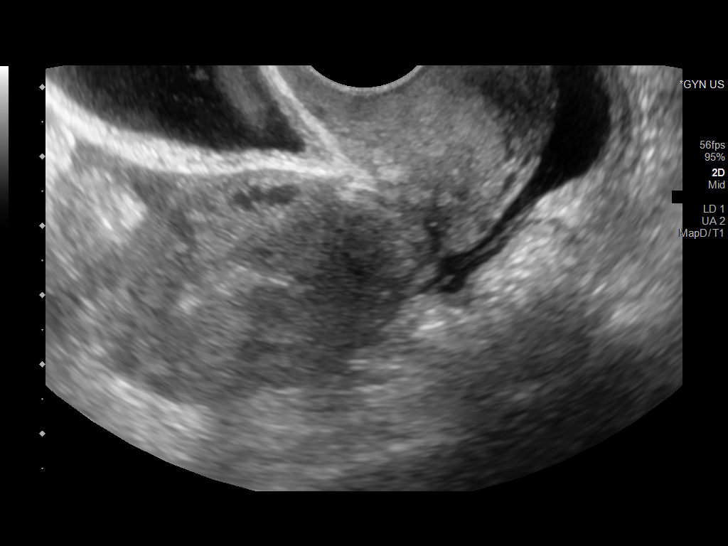
[im 17/40]
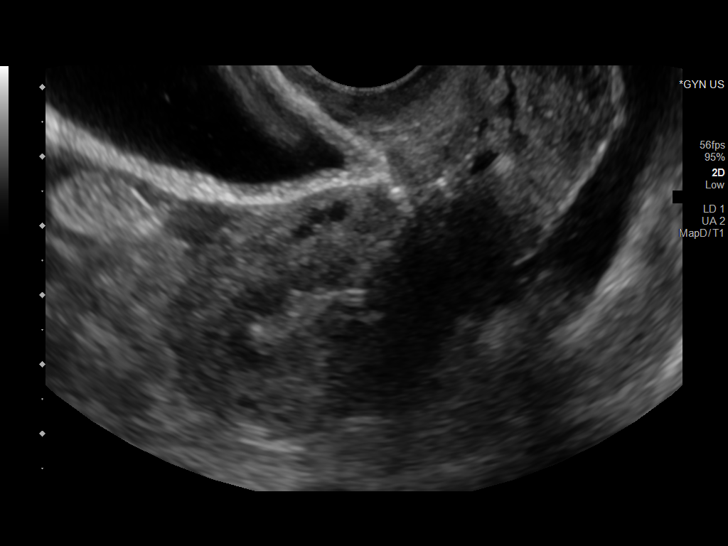
[im 20/40]
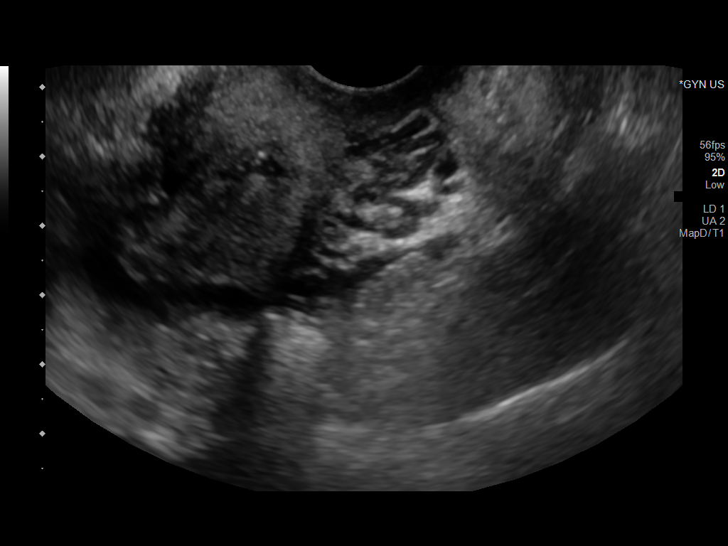
[im 23/40]
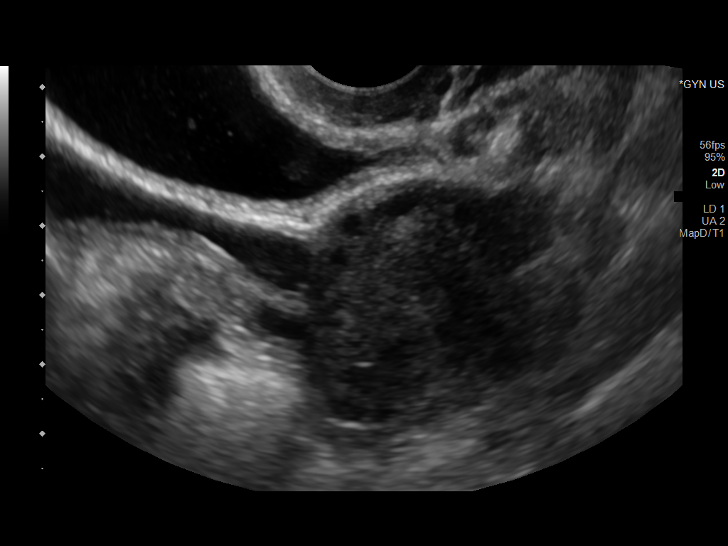
[im 27/40]
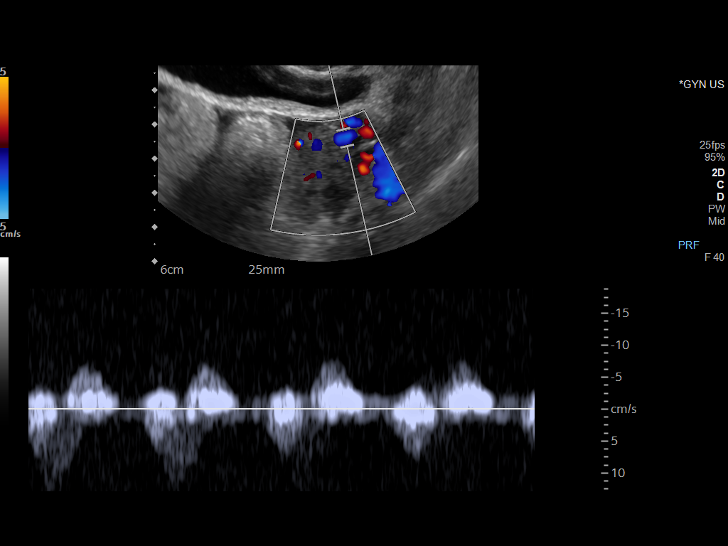
[im 30/40]
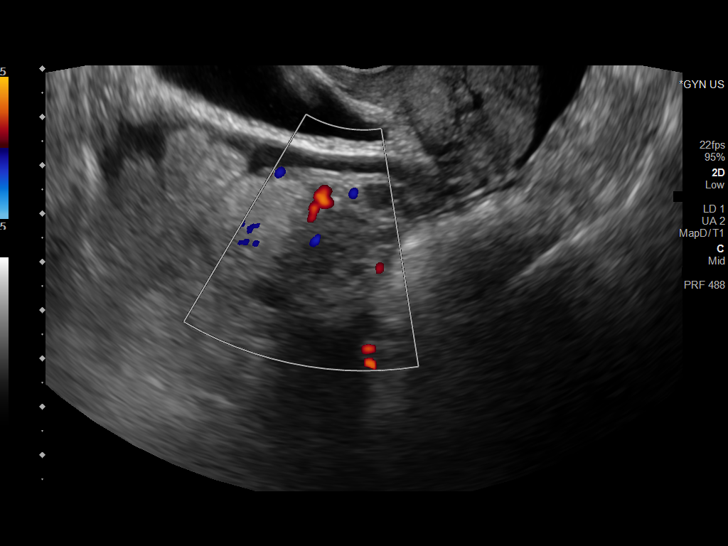
[im 33/40]
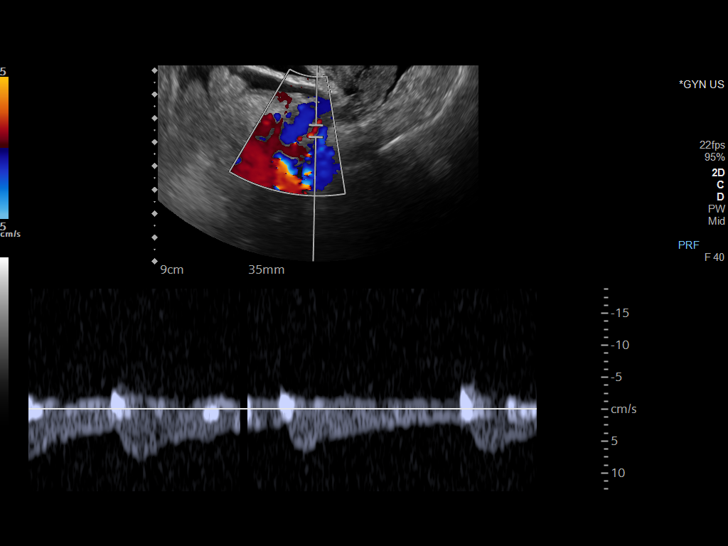
[im 36/40]
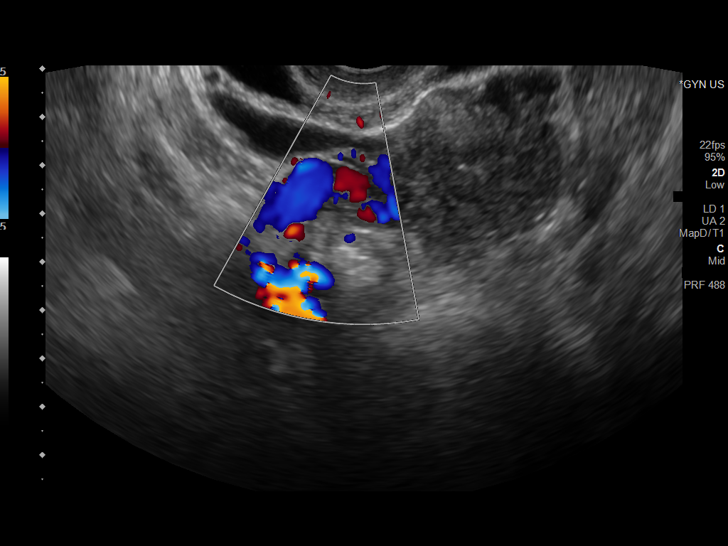
[im 40/40]
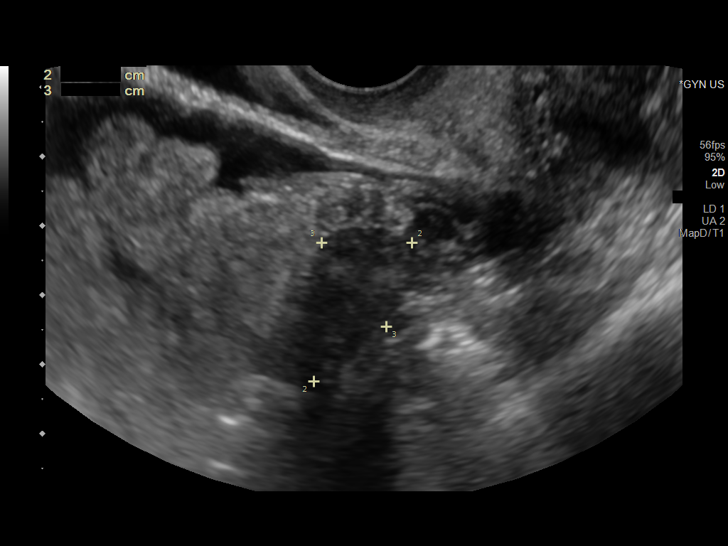

[13 of 25 positions shown; findings below may reference images not displayed]

FINDINGS: Uterus

Measurements: 8.0 x 3.3 x 3.7 cm = volume: 51 mL. Anteverted. Normal
morphology without mass

Endometrium

Thickness: 3 mm. No endometrial fluid. IUD in expected position at
the upper uterine segment endometrial canal.

Right ovary

Measurements: 2.5 x 1.5 x 1.3 cm = volume: 2.6 mL. Normal morphology
without mass. Internal blood flow present on color Doppler imaging.

Left ovary

Measurements: 3.5 x 2.0 x 2.6 cm = volume: 9.4 mL. Normal morphology
without mass. Internal blood flow present on color Doppler imaging.

Pulsed Doppler evaluation of both ovaries demonstrates normal
low-resistance arterial and venous waveforms.

Other findings

Small amount of nonspecific free pelvic fluid. No adnexal masses or
tubal dilatation.
IMPRESSION: IUD in expected position at the upper uterine segment endometrial
canal.

Otherwise normal exam.

## 2021-11-28 ENCOUNTER — Other Ambulatory Visit (HOSPITAL_BASED_OUTPATIENT_CLINIC_OR_DEPARTMENT_OTHER): Payer: Self-pay

## 2021-12-09 ENCOUNTER — Other Ambulatory Visit (HOSPITAL_BASED_OUTPATIENT_CLINIC_OR_DEPARTMENT_OTHER): Payer: Self-pay

## 2021-12-09 MED ORDER — ESTRADIOL 0.1 MG/24HR TD PTTW
MEDICATED_PATCH | TRANSDERMAL | 0 refills | Status: DC
  Filled 2021-12-09 – 2021-12-20 (×3): qty 8, 28d supply, fill #0

## 2021-12-15 ENCOUNTER — Other Ambulatory Visit (HOSPITAL_BASED_OUTPATIENT_CLINIC_OR_DEPARTMENT_OTHER): Payer: Self-pay

## 2021-12-20 ENCOUNTER — Other Ambulatory Visit (HOSPITAL_BASED_OUTPATIENT_CLINIC_OR_DEPARTMENT_OTHER): Payer: Self-pay

## 2022-01-19 ENCOUNTER — Other Ambulatory Visit (HOSPITAL_BASED_OUTPATIENT_CLINIC_OR_DEPARTMENT_OTHER): Payer: Self-pay

## 2022-01-19 DIAGNOSIS — N898 Other specified noninflammatory disorders of vagina: Secondary | ICD-10-CM | POA: Diagnosis not present

## 2022-01-19 DIAGNOSIS — E2839 Other primary ovarian failure: Secondary | ICD-10-CM | POA: Diagnosis not present

## 2022-01-19 DIAGNOSIS — Z01419 Encounter for gynecological examination (general) (routine) without abnormal findings: Secondary | ICD-10-CM | POA: Diagnosis not present

## 2022-01-19 MED ORDER — ESTRADIOL 0.1 MG/24HR TD PTTW
MEDICATED_PATCH | TRANSDERMAL | 4 refills | Status: DC
  Filled 2022-01-19: qty 24, 84d supply, fill #0
  Filled 2022-04-18: qty 24, 84d supply, fill #1
  Filled 2022-07-12: qty 24, 84d supply, fill #2
  Filled 2022-10-01: qty 24, 84d supply, fill #3
  Filled 2022-12-07: qty 24, 84d supply, fill #4

## 2022-01-20 ENCOUNTER — Other Ambulatory Visit (HOSPITAL_BASED_OUTPATIENT_CLINIC_OR_DEPARTMENT_OTHER): Payer: Self-pay

## 2022-01-23 ENCOUNTER — Other Ambulatory Visit (HOSPITAL_BASED_OUTPATIENT_CLINIC_OR_DEPARTMENT_OTHER): Payer: Self-pay

## 2022-02-01 ENCOUNTER — Other Ambulatory Visit (HOSPITAL_BASED_OUTPATIENT_CLINIC_OR_DEPARTMENT_OTHER): Payer: Self-pay

## 2022-02-01 MED ORDER — METRONIDAZOLE 500 MG PO TABS
ORAL_TABLET | ORAL | 0 refills | Status: DC
  Filled 2022-02-01: qty 14, 7d supply, fill #0

## 2022-02-08 ENCOUNTER — Other Ambulatory Visit (HOSPITAL_BASED_OUTPATIENT_CLINIC_OR_DEPARTMENT_OTHER): Payer: Self-pay

## 2022-02-15 ENCOUNTER — Other Ambulatory Visit (HOSPITAL_BASED_OUTPATIENT_CLINIC_OR_DEPARTMENT_OTHER): Payer: Self-pay

## 2022-02-15 DIAGNOSIS — R300 Dysuria: Secondary | ICD-10-CM | POA: Diagnosis not present

## 2022-02-15 DIAGNOSIS — R519 Headache, unspecified: Secondary | ICD-10-CM | POA: Diagnosis not present

## 2022-02-15 MED ORDER — PROCHLORPERAZINE MALEATE 5 MG PO TABS
ORAL_TABLET | ORAL | 0 refills | Status: DC
  Filled 2022-02-15: qty 30, 7d supply, fill #0

## 2022-03-01 DIAGNOSIS — Z0000 Encounter for general adult medical examination without abnormal findings: Secondary | ICD-10-CM | POA: Diagnosis not present

## 2022-03-01 DIAGNOSIS — M2550 Pain in unspecified joint: Secondary | ICD-10-CM | POA: Diagnosis not present

## 2022-03-01 DIAGNOSIS — E538 Deficiency of other specified B group vitamins: Secondary | ICD-10-CM | POA: Diagnosis not present

## 2022-03-01 DIAGNOSIS — Z87440 Personal history of urinary (tract) infections: Secondary | ICD-10-CM | POA: Diagnosis not present

## 2022-03-01 DIAGNOSIS — F419 Anxiety disorder, unspecified: Secondary | ICD-10-CM | POA: Diagnosis not present

## 2022-04-19 ENCOUNTER — Other Ambulatory Visit (HOSPITAL_BASED_OUTPATIENT_CLINIC_OR_DEPARTMENT_OTHER): Payer: Self-pay

## 2022-04-20 ENCOUNTER — Other Ambulatory Visit (HOSPITAL_BASED_OUTPATIENT_CLINIC_OR_DEPARTMENT_OTHER): Payer: Self-pay

## 2022-05-30 ENCOUNTER — Other Ambulatory Visit (HOSPITAL_BASED_OUTPATIENT_CLINIC_OR_DEPARTMENT_OTHER): Payer: Self-pay

## 2022-05-30 DIAGNOSIS — F411 Generalized anxiety disorder: Secondary | ICD-10-CM | POA: Diagnosis not present

## 2022-05-30 DIAGNOSIS — F410 Panic disorder [episodic paroxysmal anxiety] without agoraphobia: Secondary | ICD-10-CM | POA: Diagnosis not present

## 2022-05-30 DIAGNOSIS — F900 Attention-deficit hyperactivity disorder, predominantly inattentive type: Secondary | ICD-10-CM | POA: Diagnosis not present

## 2022-05-30 MED ORDER — FLUOXETINE HCL 10 MG PO CAPS
10.0000 mg | ORAL_CAPSULE | Freq: Every day | ORAL | 1 refills | Status: DC
  Filled 2022-05-30: qty 90, 90d supply, fill #0
  Filled 2022-09-11: qty 90, 90d supply, fill #1

## 2022-06-15 DIAGNOSIS — F900 Attention-deficit hyperactivity disorder, predominantly inattentive type: Secondary | ICD-10-CM | POA: Diagnosis not present

## 2022-06-15 DIAGNOSIS — F410 Panic disorder [episodic paroxysmal anxiety] without agoraphobia: Secondary | ICD-10-CM | POA: Diagnosis not present

## 2022-06-15 DIAGNOSIS — F411 Generalized anxiety disorder: Secondary | ICD-10-CM | POA: Diagnosis not present

## 2022-06-28 DIAGNOSIS — F410 Panic disorder [episodic paroxysmal anxiety] without agoraphobia: Secondary | ICD-10-CM | POA: Diagnosis not present

## 2022-06-28 DIAGNOSIS — F411 Generalized anxiety disorder: Secondary | ICD-10-CM | POA: Diagnosis not present

## 2022-06-28 DIAGNOSIS — F900 Attention-deficit hyperactivity disorder, predominantly inattentive type: Secondary | ICD-10-CM | POA: Diagnosis not present

## 2022-07-12 ENCOUNTER — Other Ambulatory Visit (HOSPITAL_BASED_OUTPATIENT_CLINIC_OR_DEPARTMENT_OTHER): Payer: Self-pay

## 2022-07-12 DIAGNOSIS — F410 Panic disorder [episodic paroxysmal anxiety] without agoraphobia: Secondary | ICD-10-CM | POA: Diagnosis not present

## 2022-07-12 DIAGNOSIS — F900 Attention-deficit hyperactivity disorder, predominantly inattentive type: Secondary | ICD-10-CM | POA: Diagnosis not present

## 2022-07-12 DIAGNOSIS — F411 Generalized anxiety disorder: Secondary | ICD-10-CM | POA: Diagnosis not present

## 2022-07-27 DIAGNOSIS — F411 Generalized anxiety disorder: Secondary | ICD-10-CM | POA: Diagnosis not present

## 2022-07-27 DIAGNOSIS — F909 Attention-deficit hyperactivity disorder, unspecified type: Secondary | ICD-10-CM | POA: Diagnosis not present

## 2022-07-27 DIAGNOSIS — F410 Panic disorder [episodic paroxysmal anxiety] without agoraphobia: Secondary | ICD-10-CM | POA: Diagnosis not present

## 2022-08-09 DIAGNOSIS — F410 Panic disorder [episodic paroxysmal anxiety] without agoraphobia: Secondary | ICD-10-CM | POA: Diagnosis not present

## 2022-08-09 DIAGNOSIS — F909 Attention-deficit hyperactivity disorder, unspecified type: Secondary | ICD-10-CM | POA: Diagnosis not present

## 2022-08-09 DIAGNOSIS — F411 Generalized anxiety disorder: Secondary | ICD-10-CM | POA: Diagnosis not present

## 2022-08-10 DIAGNOSIS — Z0000 Encounter for general adult medical examination without abnormal findings: Secondary | ICD-10-CM | POA: Diagnosis not present

## 2022-08-10 DIAGNOSIS — R718 Other abnormality of red blood cells: Secondary | ICD-10-CM | POA: Diagnosis not present

## 2022-09-07 DIAGNOSIS — F900 Attention-deficit hyperactivity disorder, predominantly inattentive type: Secondary | ICD-10-CM | POA: Diagnosis not present

## 2022-09-07 DIAGNOSIS — F410 Panic disorder [episodic paroxysmal anxiety] without agoraphobia: Secondary | ICD-10-CM | POA: Diagnosis not present

## 2022-09-07 DIAGNOSIS — F411 Generalized anxiety disorder: Secondary | ICD-10-CM | POA: Diagnosis not present

## 2022-09-11 ENCOUNTER — Other Ambulatory Visit (HOSPITAL_BASED_OUTPATIENT_CLINIC_OR_DEPARTMENT_OTHER): Payer: Self-pay

## 2022-10-02 ENCOUNTER — Other Ambulatory Visit (HOSPITAL_BASED_OUTPATIENT_CLINIC_OR_DEPARTMENT_OTHER): Payer: Self-pay

## 2022-10-03 ENCOUNTER — Other Ambulatory Visit (HOSPITAL_BASED_OUTPATIENT_CLINIC_OR_DEPARTMENT_OTHER): Payer: Self-pay

## 2022-10-05 DIAGNOSIS — F909 Attention-deficit hyperactivity disorder, unspecified type: Secondary | ICD-10-CM | POA: Diagnosis not present

## 2022-10-05 DIAGNOSIS — F410 Panic disorder [episodic paroxysmal anxiety] without agoraphobia: Secondary | ICD-10-CM | POA: Diagnosis not present

## 2022-10-05 DIAGNOSIS — F411 Generalized anxiety disorder: Secondary | ICD-10-CM | POA: Diagnosis not present

## 2022-10-11 DIAGNOSIS — M25579 Pain in unspecified ankle and joints of unspecified foot: Secondary | ICD-10-CM | POA: Diagnosis not present

## 2022-10-16 DIAGNOSIS — M25579 Pain in unspecified ankle and joints of unspecified foot: Secondary | ICD-10-CM | POA: Diagnosis not present

## 2022-10-23 DIAGNOSIS — M25579 Pain in unspecified ankle and joints of unspecified foot: Secondary | ICD-10-CM | POA: Diagnosis not present

## 2022-10-30 DIAGNOSIS — M25579 Pain in unspecified ankle and joints of unspecified foot: Secondary | ICD-10-CM | POA: Diagnosis not present

## 2022-11-06 ENCOUNTER — Other Ambulatory Visit (HOSPITAL_BASED_OUTPATIENT_CLINIC_OR_DEPARTMENT_OTHER): Payer: Self-pay

## 2022-11-06 DIAGNOSIS — M25561 Pain in right knee: Secondary | ICD-10-CM | POA: Diagnosis not present

## 2022-11-06 MED ORDER — MELOXICAM 15 MG PO TABS
ORAL_TABLET | ORAL | 2 refills | Status: DC
  Filled 2022-11-06: qty 30, 30d supply, fill #0

## 2022-11-08 DIAGNOSIS — M25571 Pain in right ankle and joints of right foot: Secondary | ICD-10-CM | POA: Diagnosis not present

## 2022-11-08 DIAGNOSIS — M25572 Pain in left ankle and joints of left foot: Secondary | ICD-10-CM | POA: Diagnosis not present

## 2022-11-08 DIAGNOSIS — M25579 Pain in unspecified ankle and joints of unspecified foot: Secondary | ICD-10-CM | POA: Diagnosis not present

## 2022-11-13 DIAGNOSIS — M25579 Pain in unspecified ankle and joints of unspecified foot: Secondary | ICD-10-CM | POA: Diagnosis not present

## 2022-11-13 DIAGNOSIS — F900 Attention-deficit hyperactivity disorder, predominantly inattentive type: Secondary | ICD-10-CM | POA: Diagnosis not present

## 2022-11-13 DIAGNOSIS — M25571 Pain in right ankle and joints of right foot: Secondary | ICD-10-CM | POA: Diagnosis not present

## 2022-11-13 DIAGNOSIS — F410 Panic disorder [episodic paroxysmal anxiety] without agoraphobia: Secondary | ICD-10-CM | POA: Diagnosis not present

## 2022-11-13 DIAGNOSIS — M25572 Pain in left ankle and joints of left foot: Secondary | ICD-10-CM | POA: Diagnosis not present

## 2022-11-13 DIAGNOSIS — F411 Generalized anxiety disorder: Secondary | ICD-10-CM | POA: Diagnosis not present

## 2022-11-19 DIAGNOSIS — M79669 Pain in unspecified lower leg: Secondary | ICD-10-CM | POA: Diagnosis not present

## 2022-11-19 DIAGNOSIS — M25579 Pain in unspecified ankle and joints of unspecified foot: Secondary | ICD-10-CM | POA: Diagnosis not present

## 2022-11-20 DIAGNOSIS — M25579 Pain in unspecified ankle and joints of unspecified foot: Secondary | ICD-10-CM | POA: Diagnosis not present

## 2022-11-23 DIAGNOSIS — R1013 Epigastric pain: Secondary | ICD-10-CM | POA: Diagnosis not present

## 2022-11-27 DIAGNOSIS — M25579 Pain in unspecified ankle and joints of unspecified foot: Secondary | ICD-10-CM | POA: Diagnosis not present

## 2022-11-28 ENCOUNTER — Other Ambulatory Visit (HOSPITAL_BASED_OUTPATIENT_CLINIC_OR_DEPARTMENT_OTHER): Payer: Self-pay

## 2022-11-28 MED ORDER — COMIRNATY 30 MCG/0.3ML IM SUSY
PREFILLED_SYRINGE | INTRAMUSCULAR | 0 refills | Status: DC
  Filled 2022-11-28: qty 0.3, 1d supply, fill #0

## 2022-12-07 ENCOUNTER — Other Ambulatory Visit (HOSPITAL_BASED_OUTPATIENT_CLINIC_OR_DEPARTMENT_OTHER): Payer: Self-pay

## 2023-03-03 DIAGNOSIS — B029 Zoster without complications: Secondary | ICD-10-CM | POA: Diagnosis not present

## 2023-03-06 ENCOUNTER — Other Ambulatory Visit (HOSPITAL_BASED_OUTPATIENT_CLINIC_OR_DEPARTMENT_OTHER): Payer: Self-pay

## 2023-03-06 DIAGNOSIS — E559 Vitamin D deficiency, unspecified: Secondary | ICD-10-CM | POA: Diagnosis not present

## 2023-03-06 DIAGNOSIS — Z0000 Encounter for general adult medical examination without abnormal findings: Secondary | ICD-10-CM | POA: Diagnosis not present

## 2023-03-06 DIAGNOSIS — F419 Anxiety disorder, unspecified: Secondary | ICD-10-CM | POA: Diagnosis not present

## 2023-03-06 DIAGNOSIS — B029 Zoster without complications: Secondary | ICD-10-CM | POA: Diagnosis not present

## 2023-03-06 DIAGNOSIS — E538 Deficiency of other specified B group vitamins: Secondary | ICD-10-CM | POA: Diagnosis not present

## 2023-03-06 MED ORDER — FLUOXETINE HCL 10 MG PO CAPS
ORAL_CAPSULE | ORAL | 3 refills | Status: DC
  Filled 2023-03-06: qty 90, 90d supply, fill #0
  Filled 2023-07-02: qty 90, 90d supply, fill #1
  Filled 2023-09-30: qty 90, 90d supply, fill #2
  Filled 2023-12-30: qty 90, 90d supply, fill #3

## 2023-03-06 MED ORDER — HYDROXYZINE HCL 25 MG PO TABS
25.0000 mg | ORAL_TABLET | Freq: Three times a day (TID) | ORAL | 0 refills | Status: DC | PRN
  Filled 2023-03-06: qty 30, 10d supply, fill #0

## 2023-03-14 ENCOUNTER — Other Ambulatory Visit (HOSPITAL_BASED_OUTPATIENT_CLINIC_OR_DEPARTMENT_OTHER): Payer: Self-pay

## 2023-03-14 ENCOUNTER — Telehealth: Payer: 59 | Admitting: Physician Assistant

## 2023-03-14 DIAGNOSIS — R3989 Other symptoms and signs involving the genitourinary system: Secondary | ICD-10-CM | POA: Diagnosis not present

## 2023-03-14 MED ORDER — CEPHALEXIN 500 MG PO CAPS
500.0000 mg | ORAL_CAPSULE | Freq: Two times a day (BID) | ORAL | 0 refills | Status: DC
  Filled 2023-03-14: qty 14, 7d supply, fill #0

## 2023-03-14 MED ORDER — ESTRADIOL 0.1 MG/24HR TD PTTW
MEDICATED_PATCH | TRANSDERMAL | 0 refills | Status: DC
  Filled 2023-03-14: qty 8, 28d supply, fill #0

## 2023-03-14 NOTE — Progress Notes (Signed)

## 2023-04-03 ENCOUNTER — Other Ambulatory Visit (HOSPITAL_BASED_OUTPATIENT_CLINIC_OR_DEPARTMENT_OTHER): Payer: Self-pay

## 2023-04-03 DIAGNOSIS — Z1331 Encounter for screening for depression: Secondary | ICD-10-CM | POA: Diagnosis not present

## 2023-04-03 DIAGNOSIS — Z124 Encounter for screening for malignant neoplasm of cervix: Secondary | ICD-10-CM | POA: Diagnosis not present

## 2023-04-03 DIAGNOSIS — Z01419 Encounter for gynecological examination (general) (routine) without abnormal findings: Secondary | ICD-10-CM | POA: Diagnosis not present

## 2023-04-03 MED ORDER — ESTRADIOL 0.1 MG/24HR TD PTTW
MEDICATED_PATCH | TRANSDERMAL | 3 refills | Status: DC
  Filled 2023-04-03: qty 24, 84d supply, fill #0
  Filled 2023-04-18 (×2): qty 8, 28d supply, fill #0
  Filled 2023-05-11: qty 8, 28d supply, fill #1
  Filled 2023-06-08 – 2023-06-09 (×2): qty 8, 28d supply, fill #2
  Filled 2023-07-08: qty 8, 28d supply, fill #3
  Filled 2023-08-02: qty 8, 28d supply, fill #4
  Filled 2023-08-02: qty 24, 84d supply, fill #4
  Filled 2023-09-01: qty 8, 28d supply, fill #5
  Filled 2023-09-30: qty 8, 28d supply, fill #6
  Filled 2023-10-25: qty 8, 28d supply, fill #7
  Filled 2023-11-27: qty 8, 28d supply, fill #8
  Filled 2023-12-25: qty 8, 28d supply, fill #9
  Filled 2024-01-22: qty 8, 28d supply, fill #10
  Filled 2024-02-19: qty 8, 28d supply, fill #11

## 2023-04-06 ENCOUNTER — Other Ambulatory Visit (HOSPITAL_BASED_OUTPATIENT_CLINIC_OR_DEPARTMENT_OTHER): Payer: Self-pay

## 2023-04-18 ENCOUNTER — Other Ambulatory Visit (HOSPITAL_BASED_OUTPATIENT_CLINIC_OR_DEPARTMENT_OTHER): Payer: Self-pay

## 2023-05-11 ENCOUNTER — Other Ambulatory Visit (HOSPITAL_BASED_OUTPATIENT_CLINIC_OR_DEPARTMENT_OTHER): Payer: Self-pay

## 2023-06-09 ENCOUNTER — Other Ambulatory Visit (HOSPITAL_BASED_OUTPATIENT_CLINIC_OR_DEPARTMENT_OTHER): Payer: Self-pay

## 2023-06-20 ENCOUNTER — Other Ambulatory Visit (HOSPITAL_BASED_OUTPATIENT_CLINIC_OR_DEPARTMENT_OTHER): Payer: Self-pay

## 2023-06-20 DIAGNOSIS — N921 Excessive and frequent menstruation with irregular cycle: Secondary | ICD-10-CM | POA: Diagnosis not present

## 2023-06-20 DIAGNOSIS — N898 Other specified noninflammatory disorders of vagina: Secondary | ICD-10-CM | POA: Diagnosis not present

## 2023-06-20 MED ORDER — FLUCONAZOLE 150 MG PO TABS
ORAL_TABLET | ORAL | 0 refills | Status: DC
  Filled 2023-06-20: qty 2, 3d supply, fill #0

## 2023-07-02 ENCOUNTER — Other Ambulatory Visit (HOSPITAL_BASED_OUTPATIENT_CLINIC_OR_DEPARTMENT_OTHER): Payer: Self-pay

## 2023-07-05 DIAGNOSIS — Z30433 Encounter for removal and reinsertion of intrauterine contraceptive device: Secondary | ICD-10-CM | POA: Diagnosis not present

## 2023-07-08 ENCOUNTER — Other Ambulatory Visit (HOSPITAL_BASED_OUTPATIENT_CLINIC_OR_DEPARTMENT_OTHER): Payer: Self-pay

## 2023-07-23 DIAGNOSIS — F411 Generalized anxiety disorder: Secondary | ICD-10-CM | POA: Diagnosis not present

## 2023-07-23 DIAGNOSIS — F909 Attention-deficit hyperactivity disorder, unspecified type: Secondary | ICD-10-CM | POA: Diagnosis not present

## 2023-07-23 DIAGNOSIS — F410 Panic disorder [episodic paroxysmal anxiety] without agoraphobia: Secondary | ICD-10-CM | POA: Diagnosis not present

## 2023-07-29 ENCOUNTER — Ambulatory Visit
Admission: EM | Admit: 2023-07-29 | Discharge: 2023-07-29 | Disposition: A | Discharge status: Home / Self Care | Payer: 59 | Attending: Emergency Medicine | Admitting: Emergency Medicine

## 2023-07-29 DIAGNOSIS — J309 Allergic rhinitis, unspecified: Secondary | ICD-10-CM | POA: Diagnosis not present

## 2023-07-29 DIAGNOSIS — H65191 Other acute nonsuppurative otitis media, right ear: Secondary | ICD-10-CM | POA: Diagnosis not present

## 2023-07-29 DIAGNOSIS — H60551 Acute reactive otitis externa, right ear: Secondary | ICD-10-CM | POA: Diagnosis not present

## 2023-07-29 MED ORDER — LEVOCETIRIZINE DIHYDROCHLORIDE 5 MG PO TABS: 5.0000 mg | ORAL_TABLET | Freq: Every evening | ORAL | 1 refills | Status: DC

## 2023-07-29 MED ORDER — FLUOCINOLONE ACETONIDE 0.01 % OT OIL: 5.0000 [drp] | TOPICAL_OIL | Freq: Two times a day (BID) | OTIC | 0 refills | Status: DC | PRN

## 2023-07-29 MED ORDER — AZELASTINE-FLUTICASONE 137-50 MCG/ACT NA SUSP: 1.0000 | Freq: Two times a day (BID) | NASAL | 2 refills | Status: DC

## 2023-07-29 NOTE — ED Provider Notes (Signed)
[facility]    CSN: [id number] Arrival date & time: 07/29/23  1136    HISTORY   Chief Complaint  Patient presents with   Otalgia   HPI Paige Gamble is a pleasant, 37 y.o. female who presents to urgent care today. Pt c/o pain in R ear x2 days which she states is feeling better today than it did last night. Pt was initially experiencing fullness to same [place], again states it is feeling better today than it did last night. Also reports mild sore throat yesterday, states that the soreness is more like a scratchiness that is in the very back of her throat and feels better today than it did last night.  Patient states she had a headache yesterday which has since resolved.  Patient's vital signs are normal on arrival today.  Patient denies fever, body aches, chills, nausea, vomiting, diarrhea, known sick contacts.  Patient denies a history of frequent ear infections.  Patient endorses a history of mild seasonal allergies but nothing that makes her feel she needs to take allergy medications.  The history is provided by the patient.   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 Authorizing Provider  estradiol (VIVELLE-DOT) 0.1 MG/24HR patch PLACE 1 PATCH (0.1 MG TOTAL) ONTO THE SKIN 2 (TWO) TIMES A WEEK. 10/30/19  Yes Dove, Myra C, MD  FLUoxetine (PROZAC) 10 MG capsule Take 10 mg by mouth daily. 11/24/19  Yes [provider]  vitamin B-12 (CYANOCOBALAMIN) 1000 MCG tablet Take by mouth. 02/08/18  Yes [provider]  cephALEXin (KEFLEX) 500 MG capsule Take 1 capsule (500 mg total) by mouth 2 (two) times daily. 03/14/23   Margaretann Loveless, PA-C  COVID-19 At [facility] Antigen Test KIT Use as directed 07/26/21   Sandria Manly, [facility]  COVID-19 mRNA bivalent vaccine, Pfizer, (PFIZER COVID-19 VAC BIVALENT) injection Inject into the muscle. 10/20/21   Judyann Munson, MD  COVID-19 mRNA vaccine [phone] (COMIRNATY) syringe Inject into the muscle. 11/28/22   Judyann Munson, MD  estradiol (VIVELLE-DOT) 0.1 MG/24HR patch PLACE 1 PATCH ONTO THE SKIN 2 TIMES A WEEK 11/22/20 11/22/21  Norm Parcel, PA-C  estradiol (VIVELLE-DOT) 0.1 MG/24HR patch PLACE 1 PATCH ONTO THE SKIN 2 TIMES A WEEK 04/03/23     ferrous sulfate 325 (65 FE) MG tablet Take 1 tablet (325 mg total) by mouth daily with breakfast. Patient not taking: Reported on 02/26/2018 04/18/16   Caesar Chestnut, MD  FLUoxetine (PROZAC) 10 MG capsule Take 1 capsule (10 mg total) by mouth daily. 11/09/21     FLUoxetine (PROZAC) 10 MG capsule Take 1 capsule (10 mg total) by mouth daily. 05/30/22     FLUoxetine (PROZAC) 10 MG capsule Take 1 capsule (10 mg total) by mouth daily 03/06/23     hydrOXYzine (ATARAX) 25 MG tablet Take 1 tablet (25 mg total) by mouth 3 (three) times daily as needed for anxiety 03/06/23     hydrOXYzine (ATARAX/VISTARIL) 25 MG tablet Take 1 tablet (25 mg total) by mouth 3 (three) times  daily as needed. 07/26/21     levonorgestrel (MIRENA) 20 MCG/24HR IUD 1 each by Intrauterine route once.    [provider]  meloxicam (MOBIC) 15 MG tablet Take 1 tablet (15 mg) by mouth in the morning. 11/06/22     metroNIDAZOLE (FLAGYL) 500 MG tablet Take 1 tablet twice a day by oral route for 7 days. 02/01/22     ondansetron (ZOFRAN) 4 MG tablet Take 1 tablet (4 mg total) by mouth every 6 (six) hours. 05/26/20   Curatolo, Adam, DO  Prenatal Vit-Fe Fumarate-FA (MULTIVITAMIN-PRENATAL) 27-0.8 MG TABS tablet Take 1 tablet by mouth daily at 12 noon.    [provider]  prochlorperazine (COMPAZINE) 5 MG tablet Take 1 tablet (5 mg total) by mouth every 6 (six) hours as needed for nausea. 02/15/22       Family History Family History  Problem Relation Age of Onset   Diabetes Mother    Hypertension Father    Social History Social History   Tobacco Use   Smoking status: Never   Smokeless tobacco: Never  Vaping Use   Vaping status: Never Used  Substance Use Topics   Alcohol use: Yes    Alcohol/week: 0.0 standard drinks of alcohol   Drug use: No   Allergies   Patient has no known allergies.  Review of Systems Review of Systems Pertinent findings revealed after performing a 14 point review of systems has been noted in the history of present illness.  Physical Exam Vital Signs BP 121/68 (BP Location: Right Arm)   Pulse 81   Temp 98.1 F (36.7 C) (Oral)   Resp 18   SpO2 97%   No data found.  Physical Exam Vitals and nursing note reviewed.  Constitutional:      General: She is awake. She is not in acute distress.    Appearance: Normal appearance. She is well-developed and well-groomed. She is not ill-appearing.  HENT:     Head: Normocephalic and atraumatic.     Salivary Glands: Right salivary gland is not diffusely enlarged or tender. Left salivary gland is not diffusely enlarged or tender.     Right Ear: Hearing and external ear normal. No drainage. There is no  impacted cerumen. Tympanic membrane is bulging (murky, non-purulent fluid). Tympanic membrane is not injected or erythematous.     Left Ear: Hearing, tympanic membrane, ear canal and external ear normal. No drainage.  No middle ear effusion. There is no impacted cerumen. Tympanic membrane is not erythematous or bulging.     Ears:     Comments: Right EAC with Mild erythema, no edema, no  purulent material, canal is free for cerumen    Nose: Nose normal. No nasal deformity, septal deviation, mucosal edema, congestion or rhinorrhea.     Right Turbinates: Enlarged, swollen and pale.     Left Turbinates: Enlarged, swollen and pale.     Right Sinus: No maxillary sinus tenderness or frontal sinus tenderness.     Left Sinus: No maxillary sinus tenderness or frontal sinus tenderness.     Mouth/Throat:     Lips: Pink. No lesions.     Mouth: Mucous membranes are moist. No oral lesions.     Pharynx: Oropharynx is clear. Uvula midline. Postnasal drip present. No pharyngeal swelling, oropharyngeal exudate, posterior oropharyngeal erythema or uvula swelling.     Tonsils: No tonsillar exudate. 0 on the right. 0 on the left.     Comments: Cobblestoning of posterior pharynx Eyes:     General: Lids are normal.        Right eye: No discharge.        Left eye: No discharge.     Extraocular Movements: Extraocular movements intact.     Conjunctiva/sclera: Conjunctivae normal.     Right eye: Right conjunctiva is not injected.     Left eye: Left conjunctiva is not injected.  Neck:     Trachea: Trachea and phonation normal.  Cardiovascular:     Rate and Rhythm: Normal rate and regular rhythm.     Pulses: Normal pulses.     Heart sounds: Normal heart sounds. No murmur heard.    No friction rub. No gallop.  Pulmonary:     Effort: Pulmonary effort is normal. No accessory muscle usage, prolonged expiration or respiratory distress.     Breath sounds: Normal breath sounds. No stridor, decreased air movement or  transmitted upper airway sounds. No decreased breath sounds, wheezing, rhonchi or rales.  Chest:     Chest wall: No tenderness.  Musculoskeletal:        General: Normal range of motion.     Cervical back: Normal range of motion and neck supple. Normal range of motion.  Lymphadenopathy:     Cervical: No cervical adenopathy.  Skin:    General: Skin is warm and dry.     Findings: No erythema or rash.  Neurological:     General: No focal deficit present.     Mental Status: She is alert and oriented to person, place, and time.  Psychiatric:        Mood and Affect: Mood normal.        Behavior: Behavior normal. Behavior is cooperative.     Visual Acuity Right Eye Distance:   Left Eye Distance:   Bilateral Distance:    Right Eye Near:   Left Eye Near:    Bilateral Near:     UC Couse / Diagnostics / Procedures:     Radiology No results found.  Procedures Procedures (including critical care time) EKG  Pending results:  Labs Reviewed - No data to display  Medications Ordered in UC: Medications - No data to display  UC Diagnoses / Final Clinical Impressions(s)   I have reviewed the triage vital signs and the nursing notes.  Pertinent labs & imaging results that were available during my care of the patient were reviewed by me and considered in my medical decision making (see chart for details).    Final diagnoses:  Other acute nonsuppurative otitis media of right ear, recurrence not specified  Allergic rhinitis, unspecified seasonality, unspecified trigger  Acute reactive otitis externa  of right ear   Patient advised to overall physical exam findings are more concerning for allergies then they are for infectious process.  Recommend conservative care with topical antihistamine/nasal steroid, Derm otic oil and second-generation antihistamine.  Patient given return precautions for worsening symptoms.  Conservative care recommended.  Return precautions advised.  Please see  discharge instructions below for details of plan of care as provided to patient. ED Prescriptions     Medication Sig Dispense Auth. Provider   Azelastine-Fluticasone (DYMISTA) 137-50 MCG/ACT SUSP Place 1 spray into the nose every 12 (twelve) hours. 23 g Theadora Rama Scales, PA-C   levocetirizine (XYZAL) 5 MG tablet Take 1 tablet (5 mg total) by mouth every evening. 90 tablet Theadora Rama Scales, PA-C   Fluocinolone Acetonide (DERMOTIC) 0.01 % OIL Place 5 drops in ear(s) 2 (two) times daily as needed (Itching in ears). 20 mL Theadora Rama Scales, PA-C      PDMP not reviewed this encounter.  Pending results:  Labs Reviewed - No data to display  Discharge Instructions:   Discharge Instructions      Your symptoms and my physical exam findings are concerning for exacerbation of your underlying allergies.     Xyzal (levocetirizine): This is an excellent second-generation antihistamine that helps to reduce respiratory inflammatory response to environmental allergens.  In some patients, this medication can cause daytime sleepiness so I recommend that you take 1 tablet daily at bedtime.     Dymista (fluticasone and azelastine): This is a combination nasal spray that contains both a nasal steroid and nasal antihistamine.  Please use 1 spray in each nare twice daily or every 12 hours.  This medication works best when it is used regularly, not "as needed".  You may find that it takes a few days for this to reach full effectiveness, please be patient with his onboarding process.  The most common side effect of this medication is nosebleeds.  Please discontinue use for 1 week if this occurs, then resume.   DermOtic oil (fluocinolone): This is a topical steroid oil that you can use in your ear twice daily to alleviate pain, itching and swelling.   If you find that your health insurance will not pay for allergy medications, please consider downloading the GoodRx app and using to get a better  price than the "off the shelf" price.     If symptoms have not meaningfully improved in the next 5 to 7 days, please return for repeat evaluation or follow-up with your regular provider.  If symptoms have worsened in the next 3 to 5 days, please return for repeat evaluation or follow-up with your regular provider.    Thank you for visiting urgent care today.  We appreciate the opportunity to participate in your care.       Disposition Upon Discharge:  Condition: stable for discharge home  Patient presented with an acute illness with associated systemic symptoms and significant discomfort requiring urgent management. In my opinion, this is a condition that a prudent lay person (someone who possesses an average knowledge of health and medicine) may potentially expect to result in complications if not addressed urgently such as respiratory distress, impairment of bodily function or dysfunction of bodily organs.   Routine symptom specific, illness specific and/or disease specific instructions were discussed with the patient and/or caregiver at length.   As such, the patient has been evaluated and assessed, work-up was performed and treatment was provided in alignment with urgent care protocols and evidence based medicine.  Patient/parent/caregiver has been advised that the patient may require follow up for further testing and treatment if the symptoms continue in spite of treatment, as clinically indicated and appropriate.  Patient/parent/caregiver has been advised to return to the [facility] or PCP if no better; to PCP or the Emergency Department if new signs and symptoms develop, or if the current signs or symptoms continue to change or worsen for further workup, evaluation and treatment as clinically indicated and appropriate  The patient will follow up with their current PCP if and as advised. If the patient does not currently have a PCP we will assist them in obtaining one.   The patient may need  specialty follow up if the symptoms continue, in spite of conservative treatment and management, for further workup, evaluation, consultation and treatment as clinically indicated and appropriate.  Patient/parent/caregiver verbalized understanding and agreement of plan as discussed.  All questions were addressed during visit.  Please see discharge instructions below for further details of plan.  This office note has been dictated using Teaching laboratory technician.  Unfortunately, this method of dictation can sometimes lead to typographical or grammatical errors.  I apologize for your inconvenience in advance if this occurs.  Please do not hesitate to reach out to me if clarification is needed.      Theadora Rama Scales, PA-C 07/29/23 [phone]

## 2023-07-29 NOTE — ED Triage Notes (Signed)
Pt c/o pain in R ear x2 days. Pt was initially experiencing fullness to [place]. Also reports mild sore throat and HA yesterday.

## 2023-07-29 NOTE — Discharge Instructions (Signed)
Your symptoms and my physical exam findings are concerning for exacerbation of your underlying allergies.     Xyzal (levocetirizine): This is an excellent second-generation antihistamine that helps to reduce respiratory inflammatory response to environmental allergens.  In some patients, this medication can cause daytime sleepiness so I recommend that you take 1 tablet daily at bedtime.     Dymista (fluticasone and azelastine): This is a combination nasal spray that contains both a nasal steroid and nasal antihistamine.  Please use 1 spray in each nare twice daily or every 12 hours.  This medication works best when it is used regularly, not "as needed".  You may find that it takes a few days for this to reach full effectiveness, please be patient with his onboarding process.  The most common side effect of this medication is nosebleeds.  Please discontinue use for 1 week if this occurs, then resume.   DermOtic oil (fluocinolone): This is a topical steroid oil that you can use in your ear twice daily to alleviate pain, itching and swelling.   If you find that your health insurance will not pay for allergy medications, please consider downloading the GoodRx app and using to get a better price than the "off the shelf" price.     If symptoms have not meaningfully improved in the next 5 to 7 days, please return for repeat evaluation or follow-up with your regular provider.  If symptoms have worsened in the next 3 to 5 days, please return for repeat evaluation or follow-up with your regular provider.    Thank you for visiting urgent care today.  We appreciate the opportunity to participate in your care.

## 2023-08-02 ENCOUNTER — Other Ambulatory Visit (HOSPITAL_BASED_OUTPATIENT_CLINIC_OR_DEPARTMENT_OTHER): Payer: Self-pay

## 2023-08-08 DIAGNOSIS — M25579 Pain in unspecified ankle and joints of unspecified foot: Secondary | ICD-10-CM | POA: Diagnosis not present

## 2023-08-08 DIAGNOSIS — M79669 Pain in unspecified lower leg: Secondary | ICD-10-CM | POA: Diagnosis not present

## 2023-08-14 DIAGNOSIS — M25579 Pain in unspecified ankle and joints of unspecified foot: Secondary | ICD-10-CM | POA: Diagnosis not present

## 2023-08-14 DIAGNOSIS — M79669 Pain in unspecified lower leg: Secondary | ICD-10-CM | POA: Diagnosis not present

## 2023-08-16 DIAGNOSIS — Z30431 Encounter for routine checking of intrauterine contraceptive device: Secondary | ICD-10-CM | POA: Diagnosis not present

## 2023-08-20 DIAGNOSIS — M79669 Pain in unspecified lower leg: Secondary | ICD-10-CM | POA: Diagnosis not present

## 2023-08-20 DIAGNOSIS — M25579 Pain in unspecified ankle and joints of unspecified foot: Secondary | ICD-10-CM | POA: Diagnosis not present

## 2023-08-24 DIAGNOSIS — F411 Generalized anxiety disorder: Secondary | ICD-10-CM | POA: Diagnosis not present

## 2023-08-27 DIAGNOSIS — M79669 Pain in unspecified lower leg: Secondary | ICD-10-CM | POA: Diagnosis not present

## 2023-08-27 DIAGNOSIS — M25579 Pain in unspecified ankle and joints of unspecified foot: Secondary | ICD-10-CM | POA: Diagnosis not present

## 2023-08-28 ENCOUNTER — Telehealth: Payer: 59 | Admitting: Physician Assistant

## 2023-08-28 ENCOUNTER — Other Ambulatory Visit (HOSPITAL_BASED_OUTPATIENT_CLINIC_OR_DEPARTMENT_OTHER): Payer: Self-pay

## 2023-08-28 DIAGNOSIS — R3989 Other symptoms and signs involving the genitourinary system: Secondary | ICD-10-CM | POA: Diagnosis not present

## 2023-08-28 MED ORDER — CEPHALEXIN 500 MG PO CAPS
500.0000 mg | ORAL_CAPSULE | Freq: Two times a day (BID) | ORAL | 0 refills | Status: AC
  Filled 2023-08-28: qty 14, 7d supply, fill #0

## 2023-08-28 NOTE — Progress Notes (Signed)

## 2023-08-28 NOTE — Progress Notes (Signed)
I have spent 5 minutes in review of e-visit questionnaire, review and updating patient chart, medical decision making and response to patient.   William Cody Martin, PA-C    

## 2023-08-29 ENCOUNTER — Other Ambulatory Visit (HOSPITAL_BASED_OUTPATIENT_CLINIC_OR_DEPARTMENT_OTHER): Payer: Self-pay

## 2023-09-01 ENCOUNTER — Other Ambulatory Visit (HOSPITAL_BASED_OUTPATIENT_CLINIC_OR_DEPARTMENT_OTHER): Payer: Self-pay

## 2023-09-03 DIAGNOSIS — M79669 Pain in unspecified lower leg: Secondary | ICD-10-CM | POA: Diagnosis not present

## 2023-09-03 DIAGNOSIS — M25579 Pain in unspecified ankle and joints of unspecified foot: Secondary | ICD-10-CM | POA: Diagnosis not present

## 2023-09-14 DIAGNOSIS — F411 Generalized anxiety disorder: Secondary | ICD-10-CM | POA: Diagnosis not present

## 2023-09-14 DIAGNOSIS — F900 Attention-deficit hyperactivity disorder, predominantly inattentive type: Secondary | ICD-10-CM | POA: Diagnosis not present

## 2023-09-19 DIAGNOSIS — M79669 Pain in unspecified lower leg: Secondary | ICD-10-CM | POA: Diagnosis not present

## 2023-09-19 DIAGNOSIS — M25579 Pain in unspecified ankle and joints of unspecified foot: Secondary | ICD-10-CM | POA: Diagnosis not present

## 2023-09-26 DIAGNOSIS — M25579 Pain in unspecified ankle and joints of unspecified foot: Secondary | ICD-10-CM | POA: Diagnosis not present

## 2023-09-26 DIAGNOSIS — M79669 Pain in unspecified lower leg: Secondary | ICD-10-CM | POA: Diagnosis not present

## 2023-10-01 ENCOUNTER — Other Ambulatory Visit (HOSPITAL_BASED_OUTPATIENT_CLINIC_OR_DEPARTMENT_OTHER): Payer: Self-pay

## 2023-10-01 DIAGNOSIS — M25579 Pain in unspecified ankle and joints of unspecified foot: Secondary | ICD-10-CM | POA: Diagnosis not present

## 2023-10-01 DIAGNOSIS — M79669 Pain in unspecified lower leg: Secondary | ICD-10-CM | POA: Diagnosis not present

## 2023-10-09 DIAGNOSIS — M25579 Pain in unspecified ankle and joints of unspecified foot: Secondary | ICD-10-CM | POA: Diagnosis not present

## 2023-10-09 DIAGNOSIS — M79669 Pain in unspecified lower leg: Secondary | ICD-10-CM | POA: Diagnosis not present

## 2023-10-12 DIAGNOSIS — F411 Generalized anxiety disorder: Secondary | ICD-10-CM | POA: Diagnosis not present

## 2023-10-12 DIAGNOSIS — F900 Attention-deficit hyperactivity disorder, predominantly inattentive type: Secondary | ICD-10-CM | POA: Diagnosis not present

## 2023-10-15 DIAGNOSIS — M25579 Pain in unspecified ankle and joints of unspecified foot: Secondary | ICD-10-CM | POA: Diagnosis not present

## 2023-10-15 DIAGNOSIS — M79669 Pain in unspecified lower leg: Secondary | ICD-10-CM | POA: Diagnosis not present

## 2023-10-19 ENCOUNTER — Other Ambulatory Visit (HOSPITAL_BASED_OUTPATIENT_CLINIC_OR_DEPARTMENT_OTHER): Payer: Self-pay

## 2023-10-19 MED ORDER — COVID-19 MRNA VAC-TRIS(PFIZER) 30 MCG/0.3ML IM SUSY
0.3000 mL | PREFILLED_SYRINGE | Freq: Once | INTRAMUSCULAR | 0 refills | Status: AC
  Filled 2023-10-19: qty 0.3, 1d supply, fill #0

## 2023-10-19 MED ORDER — INFLUENZA VIRUS VACC SPLIT PF (FLUZONE) 0.5 ML IM SUSY
0.5000 mL | PREFILLED_SYRINGE | Freq: Once | INTRAMUSCULAR | 0 refills | Status: AC
  Filled 2023-10-19: qty 0.5, 1d supply, fill #0

## 2023-10-22 DIAGNOSIS — M79669 Pain in unspecified lower leg: Secondary | ICD-10-CM | POA: Diagnosis not present

## 2023-10-22 DIAGNOSIS — M25579 Pain in unspecified ankle and joints of unspecified foot: Secondary | ICD-10-CM | POA: Diagnosis not present

## 2023-10-29 DIAGNOSIS — M79669 Pain in unspecified lower leg: Secondary | ICD-10-CM | POA: Diagnosis not present

## 2023-10-29 DIAGNOSIS — M25579 Pain in unspecified ankle and joints of unspecified foot: Secondary | ICD-10-CM | POA: Diagnosis not present

## 2023-11-05 DIAGNOSIS — M25579 Pain in unspecified ankle and joints of unspecified foot: Secondary | ICD-10-CM | POA: Diagnosis not present

## 2023-11-05 DIAGNOSIS — M79669 Pain in unspecified lower leg: Secondary | ICD-10-CM | POA: Diagnosis not present

## 2023-11-19 DIAGNOSIS — M25579 Pain in unspecified ankle and joints of unspecified foot: Secondary | ICD-10-CM | POA: Diagnosis not present

## 2023-11-19 DIAGNOSIS — M79669 Pain in unspecified lower leg: Secondary | ICD-10-CM | POA: Diagnosis not present

## 2023-11-26 DIAGNOSIS — F411 Generalized anxiety disorder: Secondary | ICD-10-CM | POA: Diagnosis not present

## 2023-12-05 DIAGNOSIS — M79669 Pain in unspecified lower leg: Secondary | ICD-10-CM | POA: Diagnosis not present

## 2023-12-05 DIAGNOSIS — M25579 Pain in unspecified ankle and joints of unspecified foot: Secondary | ICD-10-CM | POA: Diagnosis not present

## 2023-12-26 DIAGNOSIS — M79669 Pain in unspecified lower leg: Secondary | ICD-10-CM | POA: Diagnosis not present

## 2023-12-26 DIAGNOSIS — M25579 Pain in unspecified ankle and joints of unspecified foot: Secondary | ICD-10-CM | POA: Diagnosis not present

## 2023-12-31 ENCOUNTER — Other Ambulatory Visit (HOSPITAL_BASED_OUTPATIENT_CLINIC_OR_DEPARTMENT_OTHER): Payer: Self-pay

## 2024-01-02 ENCOUNTER — Other Ambulatory Visit (HOSPITAL_BASED_OUTPATIENT_CLINIC_OR_DEPARTMENT_OTHER): Payer: Self-pay

## 2024-01-02 DIAGNOSIS — M79669 Pain in unspecified lower leg: Secondary | ICD-10-CM | POA: Diagnosis not present

## 2024-01-02 DIAGNOSIS — M25579 Pain in unspecified ankle and joints of unspecified foot: Secondary | ICD-10-CM | POA: Diagnosis not present

## 2024-01-09 DIAGNOSIS — M25579 Pain in unspecified ankle and joints of unspecified foot: Secondary | ICD-10-CM | POA: Diagnosis not present

## 2024-01-09 DIAGNOSIS — M79669 Pain in unspecified lower leg: Secondary | ICD-10-CM | POA: Diagnosis not present

## 2024-01-15 DIAGNOSIS — M79672 Pain in left foot: Secondary | ICD-10-CM | POA: Diagnosis not present

## 2024-01-15 DIAGNOSIS — Z13220 Encounter for screening for lipoid disorders: Secondary | ICD-10-CM | POA: Diagnosis not present

## 2024-01-15 DIAGNOSIS — W19XXXA Unspecified fall, initial encounter: Secondary | ICD-10-CM | POA: Diagnosis not present

## 2024-01-15 DIAGNOSIS — Z0000 Encounter for general adult medical examination without abnormal findings: Secondary | ICD-10-CM | POA: Diagnosis not present

## 2024-01-15 DIAGNOSIS — M25512 Pain in left shoulder: Secondary | ICD-10-CM | POA: Diagnosis not present

## 2024-01-15 DIAGNOSIS — E538 Deficiency of other specified B group vitamins: Secondary | ICD-10-CM | POA: Diagnosis not present

## 2024-01-15 DIAGNOSIS — E559 Vitamin D deficiency, unspecified: Secondary | ICD-10-CM | POA: Diagnosis not present

## 2024-01-16 DIAGNOSIS — M79669 Pain in unspecified lower leg: Secondary | ICD-10-CM | POA: Diagnosis not present

## 2024-01-16 DIAGNOSIS — M25579 Pain in unspecified ankle and joints of unspecified foot: Secondary | ICD-10-CM | POA: Diagnosis not present

## 2024-01-30 DIAGNOSIS — M79669 Pain in unspecified lower leg: Secondary | ICD-10-CM | POA: Diagnosis not present

## 2024-01-30 DIAGNOSIS — M25579 Pain in unspecified ankle and joints of unspecified foot: Secondary | ICD-10-CM | POA: Diagnosis not present

## 2024-02-04 ENCOUNTER — Ambulatory Visit
Admission: EM | Admit: 2024-02-04 | Discharge: 2024-02-04 | Disposition: A | Discharge status: Home / Self Care | Payer: Commercial Managed Care - PPO | Attending: Emergency Medicine | Admitting: Emergency Medicine

## 2024-02-04 ENCOUNTER — Other Ambulatory Visit: Payer: Self-pay

## 2024-02-04 ENCOUNTER — Encounter: Payer: Self-pay | Admitting: Emergency Medicine

## 2024-02-04 DIAGNOSIS — Z20828 Contact with and (suspected) exposure to other viral communicable diseases: Secondary | ICD-10-CM | POA: Diagnosis not present

## 2024-02-04 DIAGNOSIS — J101 Influenza due to other identified influenza virus with other respiratory manifestations: Secondary | ICD-10-CM | POA: Diagnosis not present

## 2024-02-04 LAB — POCT INFLUENZA A/B
Influenza A, POC: POSITIVE — AB
Influenza B, POC: NEGATIVE

## 2024-02-04 MED ORDER — PROMETHAZINE-DM 6.25-15 MG/5ML PO SYRP: 5.0000 mL | ORAL_SOLUTION | Freq: Four times a day (QID) | ORAL | 0 refills | Status: AC | PRN

## 2024-02-04 MED ORDER — FLUTICASONE PROPIONATE 50 MCG/ACT NA SUSP: 1.0000 | Freq: Every day | NASAL | 2 refills | Status: AC

## 2024-02-04 MED ORDER — OSELTAMIVIR PHOSPHATE 75 MG PO CAPS: 75.0000 mg | ORAL_CAPSULE | Freq: Two times a day (BID) | ORAL | 0 refills | Status: AC

## 2024-02-04 NOTE — ED Triage Notes (Signed)
 Cough chills and body aches started Friday with a sore throat.  Sore throat went away, but now has other symptoms and they have worsened.    Has taken nyquil -but no medicines today

## 2024-02-04 NOTE — Discharge Instructions (Signed)
 Your rapid influenza antigen test today was positive.  I recommend that you begin taking Tamiflu  now for treatment of influenza.  Tamiflu  will reduce the amount of virus in your body and stop it from reproducing.  This will help keep you from feeling any sicker and help you feel better sooner.  I have sent a prescription to your pharmacy.     Please read below to learn more about the medications, dosages and frequencies that I recommend to help alleviate your symptoms and to get you feeling better soon:   Advil , Motrin  (ibuprofen ): This is a good anti-inflammatory medication which not only addresses aches, pains but also significantly reduces soft tissue inflammation of the upper airways that causes sinus and nasal congestion as well as inflammation of the lower airways which makes you feel like your breathing is constricted or your cough feel tight.  I recommend that you take 400 mg every 8 hours as needed.      Flonase  (fluticasone ): This is a steroid nasal spray that used once daily, 1 spray in each nare.  This works best when used on a daily basis. This medication does not work well if it is only used when you think you need it.  After 3 to 5 days of use, you will notice significant reduction of the inflammation and mucus production that is currently being caused by exposure to allergens, whether seasonal or environmental.  The most common side effect of this medication is nosebleeds.  If you experience a nosebleed, please discontinue use for 1 week, then feel free to resume.  If you find that your insurance will not pay for this medication, please consider a different nasal steroids such as Nasonex (mometasone), or Nasacort  (triamcinolone ).   Advil , Motrin  (ibuprofen ): This is a good anti-inflammatory medication which addresses aches, pains and inflammation of the upper airways that causes sinus and nasal congestion as well as in the lower airways which makes your cough feel tight and sometimes burn.  I  recommend that you take between 400 to 600 mg every 6-8 hours as needed.  Please do not take more than 2400 mg of ibuprofen  in a 24-hour period and please do not take high doses of ibuprofen  for more than 3 days in a row as this can lead to stomach ulcers.   Robitussin, Mucinex (guaifenesin): This is a daytime expectorant.  This single symptom reliever helps break up chest congestion and loosen up thick nasal drainage making phlegm and drainage easier to cough up and to blow out from your nose.  I recommend taking 400 mg in either liquid or tablet form three times daily as needed.  I do not recommend the 12-hour extended relief version or doses higher than 400 mg per each dose as these often make some patients feel jittery or jumpy and can interfere with sleep.  I also do not recommend that you purchase guaifenesin with the ingredient " DM" which is dextromethorphan, a cough suppressant which I only recommend taking at bedtime.  Guaifenesin 400 mg is a safe dose for people who are being treated for high blood pressure.     Promethazine  DM: Promethazine  is both a nasal decongestant that dries up mucous membranes and an antinausea medication.  Promethazine  often makes most patients feel fairly sleepy.  "DM" is dextromethorphan, a single symptom reliever which is a cough suppressant found in many over-the-counter cough medications and combination cold preparations.  Please take 5 mL before bedtime to minimize your cough which will  help you sleep better.  I have sent a prescription for this medication to your pharmacy because it cannot be purchased over-the-counter.   If symptoms have not meaningfully improved in the next 5 to 7 days, please return for repeat evaluation or follow-up with your regular provider.  If symptoms have worsened in the next 3 to 5 days, please go to the emergency room for further evaluation.    Thank you for visiting urgent care today.  We appreciate the opportunity to participate in  your care.

## 2024-02-04 NOTE — ED Provider Notes (Signed)
 [facility]    CSN: [id number] Arrival date & time: 02/04/24  4098    HISTORY   Chief Complaint  Patient presents with   Cough   HPI Paige Gamble is a pleasant, 37 y.o. female who presents to urgent care today. Patient states that 2 days ago, she began to get a sore throat which got progressively worse but now feels a little bit better.  States 1 day ago she was sneezing a lot but this also seems better.  Patient states she is now having chills, body aches, intermittent nonproductive cough.  Patient states one of her coworkers was diagnosed with influenza 1 week ago.  States no one at home has been ill.  Denies known fever, nausea, vomiting, diarrhea, nasal congestion, rhinorrhea.  The history is provided by the patient.   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 Authorizing Provider  Azelastine -Fluticasone  (DYMISTA ) 137-50 MCG/ACT SUSP Place 1 spray into the nose every 12 (twelve)  hours. Patient not taking: Reported on 02/04/2024 07/29/23 08/28/23  Eloise Hake Scales, PA-C  estradiol  (VIVELLE -DOT) 0.1 MG/24HR patch PLACE 1 PATCH ONTO THE SKIN 2 TIMES A WEEK 04/03/23     Fluocinolone  Acetonide (DERMOTIC ) 0.01 % OIL Place 5 drops in ear(s) 2 (two) times daily as needed (Itching in ears). Patient not taking: Reported on 02/04/2024 07/29/23   Eloise Hake Scales, PA-C  FLUoxetine  (PROZAC ) 10 MG capsule Take 1 capsule (10 mg total) by mouth daily 03/06/23     hydrOXYzine  (ATARAX ) 25 MG tablet Take 1 tablet (25 mg total) by mouth 3 (three) times daily as needed for anxiety Patient not taking: Reported on 02/04/2024 03/06/23     levocetirizine (XYZAL ) 5 MG tablet Take 1 tablet (5 mg total) by mouth every evening. Patient not taking: Reported on 02/04/2024 07/29/23 01/25/24  Eloise Hake Scales, PA-C  meloxicam  (MOBIC ) 15 MG tablet Take 1 tablet (15 mg) by mouth in the morning. Patient not taking: Reported on 02/04/2024 11/06/22       Family History Family History  Problem Relation Age of Onset   Diabetes Mother    Hypertension Father    Social History Social History   Tobacco Use  Smoking status: Never   Smokeless tobacco: Never  Vaping Use   Vaping status: Never Used  Substance Use Topics   Alcohol use: Yes    Alcohol/week: 0.0 standard drinks of alcohol   Drug use: No   Allergies   Patient has no known allergies.  Review of Systems Review of Systems Pertinent findings revealed after performing a 14 point review of systems has been noted in the history of present illness.  Physical Exam Vital Signs BP 119/80 (BP Location: Right Arm)   Pulse (!) 110   Temp 99.9 F (37.7 C) (Temporal)   Resp 18   LMP  (LMP Unknown)   SpO2 96%   No data found.  Physical Exam Vitals and nursing note reviewed.  Constitutional:      General: She is awake. She is not in acute distress.    Appearance: Normal appearance. She is well-developed and well-groomed. She is not  ill-appearing.  HENT:     Head: Normocephalic and atraumatic.     Salivary Glands: Right salivary gland is not diffusely enlarged or tender. Left salivary gland is not diffusely enlarged or tender.     Right Ear: Hearing, tympanic membrane and external ear normal.     Left Ear: Hearing, tympanic membrane and external ear normal.     Ears:     Comments: Bilateral EACs with erythema    Nose: Congestion and rhinorrhea present. Rhinorrhea is clear.     Right Sinus: No maxillary sinus tenderness or frontal sinus tenderness.     Left Sinus: No maxillary sinus tenderness.     Mouth/Throat:     Mouth: Mucous membranes are moist.     Pharynx: Pharyngeal swelling, posterior oropharyngeal erythema and uvula swelling present.     Tonsils: No tonsillar exudate. 0 on the right. 0 on the left.  Cardiovascular:     Rate and Rhythm: Normal rate and regular rhythm.     Pulses: Normal pulses.  Pulmonary:     Effort: Pulmonary effort is normal. No accessory muscle usage, prolonged expiration or respiratory distress.     Breath sounds: No stridor. No wheezing, rhonchi or rales.     Comments: Turbulent breath sounds throughout without wheeze, rale, rhonchi. Abdominal:     General: Abdomen is flat. Bowel sounds are normal.     Palpations: Abdomen is soft.  Musculoskeletal:        General: Normal range of motion.  Lymphadenopathy:     Cervical: Cervical adenopathy present.     Right cervical: Superficial cervical adenopathy and posterior cervical adenopathy present.     Left cervical: Superficial cervical adenopathy and posterior cervical adenopathy present.  Skin:    General: Skin is warm and dry.  Neurological:     General: No focal deficit present.     Mental Status: She is alert and oriented to person, place, and time.     Motor: Motor function is intact.     Coordination: Coordination is intact.     Gait: Gait is intact.     Deep Tendon Reflexes: Reflexes are normal and symmetric.  Psychiatric:         Attention and Perception: Attention and perception normal.        Mood and Affect: Mood and affect normal.        Speech: Speech normal.        Behavior: Behavior normal. Behavior is cooperative.        Thought Content: Thought content normal.     Visual  Acuity Right Eye Distance:   Left Eye Distance:   Bilateral Distance:    Right Eye Near:   Left Eye Near:    Bilateral Near:     UC Couse / Diagnostics / Procedures:     Radiology No results found.  Procedures Procedures (including critical care time) EKG  Pending results:  Labs Reviewed  POCT INFLUENZA A/B - Abnormal; Notable for the following components:      Result Value   Influenza A, POC Positive (*)    All other components within normal limits    Medications Ordered in UC: Medications - No data to display  UC Diagnoses / Final Clinical Impressions(s)   I have reviewed the triage vital signs and the nursing notes.  Pertinent labs & imaging results that were available during my care of the patient were reviewed by me and considered in my medical decision making (see chart for details).    Final diagnoses:  Influenza A  Exposure to influenza   Influenza test today is positive for influenza A.  Patient provided with a 5-day course of Tamiflu .  Also recommend ibuprofen  to reduce respiratory inflammation, Flonase  nasal spray to reduce respiratory inflammation and Promethazine  DM for nighttime cough.  Patient was advised to have family members reach out to their primary care provider to discuss influenza prophylaxis with Tamiflu .  Conservative care recommended.  Return precautions advised.  Please see discharge instructions below for details of plan of care as provided to patient. ED Prescriptions     Medication Sig Dispense Auth. Provider   oseltamivir  (TAMIFLU ) 75 MG capsule Take 1 capsule (75 mg total) by mouth every 12 (twelve) hours for 5 days. 10 capsule Eloise Hake Scales, PA-C   fluticasone   (FLONASE ) 50 MCG/ACT nasal spray Place 1 spray into both nostrils daily. Begin by using 2 sprays in each nare daily for 3 to 5 days, then decrease to 1 spray in each nare daily. 15.8 mL Eloise Hake Scales, PA-C   promethazine -dextromethorphan (PROMETHAZINE -DM) 6.25-15 MG/5ML syrup Take 5 mLs by mouth 4 (four) times daily as needed for cough. 118 mL Eloise Hake Scales, PA-C      PDMP not reviewed this encounter.  Pending results:  Labs Reviewed  POCT INFLUENZA A/B - Abnormal; Notable for the following components:      Result Value   Influenza A, POC Positive (*)    All other components within normal limits      Discharge Instructions      Your rapid influenza antigen test today was positive.  I recommend that you begin taking Tamiflu  now for treatment of influenza.  Tamiflu  will reduce the amount of virus in your body and stop it from reproducing.  This will help keep you from feeling any sicker and help you feel better sooner.  I have sent a prescription to your pharmacy.     Please read below to learn more about the medications, dosages and frequencies that I recommend to help alleviate your symptoms and to get you feeling better soon:   Advil , Motrin  (ibuprofen ): This is a good anti-inflammatory medication which not only addresses aches, pains but also significantly reduces soft tissue inflammation of the upper airways that causes sinus and nasal congestion as well as inflammation of the lower airways which makes you feel like your breathing is constricted or your cough feel tight.  I recommend that you take 400 mg every 8 hours as needed.      Flonase  (fluticasone ): This is a steroid  nasal spray that used once daily, 1 spray in each nare.  This works best when used on a daily basis. This medication does not work well if it is only used when you think you need it.  After 3 to 5 days of use, you will notice significant reduction of the inflammation and mucus production that is  currently being caused by exposure to allergens, whether seasonal or environmental.  The most common side effect of this medication is nosebleeds.  If you experience a nosebleed, please discontinue use for 1 week, then feel free to resume.  If you find that your insurance will not pay for this medication, please consider a different nasal steroids such as Nasonex (mometasone), or Nasacort  (triamcinolone ).   Advil , Motrin  (ibuprofen ): This is a good anti-inflammatory medication which addresses aches, pains and inflammation of the upper airways that causes sinus and nasal congestion as well as in the lower airways which makes your cough feel tight and sometimes burn.  I recommend that you take between 400 to 600 mg every 6-8 hours as needed.  Please do not take more than 2400 mg of ibuprofen  in a 24-hour period and please do not take high doses of ibuprofen  for more than 3 days in a row as this can lead to stomach ulcers.   Robitussin, Mucinex (guaifenesin): This is a daytime expectorant.  This single symptom reliever helps break up chest congestion and loosen up thick nasal drainage making phlegm and drainage easier to cough up and to blow out from your nose.  I recommend taking 400 mg in either liquid or tablet form three times daily as needed.  I do not recommend the 12-hour extended relief version or doses higher than 400 mg per each dose as these often make some patients feel jittery or jumpy and can interfere with sleep.  I also do not recommend that you purchase guaifenesin with the ingredient " DM" which is dextromethorphan, a cough suppressant which I only recommend taking at bedtime.  Guaifenesin 400 mg is a safe dose for people who are being treated for high blood pressure.     Promethazine  DM: Promethazine  is both a nasal decongestant that dries up mucous membranes and an antinausea medication.  Promethazine  often makes most patients feel fairly sleepy.  "DM" is dextromethorphan, a single symptom  reliever which is a cough suppressant found in many over-the-counter cough medications and combination cold preparations.  Please take 5 mL before bedtime to minimize your cough which will help you sleep better.  I have sent a prescription for this medication to your pharmacy because it cannot be purchased over-the-counter.   If symptoms have not meaningfully improved in the next 5 to 7 days, please return for repeat evaluation or follow-up with your regular provider.  If symptoms have worsened in the next 3 to 5 days, please go to the emergency room for further evaluation.    Thank you for visiting urgent care today.  We appreciate the opportunity to participate in your care.       Disposition Upon Discharge:  Condition: stable for discharge home  Patient presented with an acute illness with associated systemic symptoms and significant discomfort requiring urgent management. In my opinion, this is a condition that a prudent lay person (someone who possesses an average knowledge of health and medicine) may potentially expect to result in complications if not addressed urgently such as respiratory distress, impairment of bodily function or dysfunction of bodily organs.   Routine symptom specific,  illness specific and/or disease specific instructions were discussed with the patient and/or caregiver at length.   As such, the patient has been evaluated and assessed, work-up was performed and treatment was provided in alignment with urgent care protocols and evidence based medicine.  Patient/parent/caregiver has been advised that the patient may require follow up for further testing and treatment if the symptoms continue in spite of treatment, as clinically indicated and appropriate.  Patient/parent/caregiver has been advised to return to the [facility] or PCP if no better; to PCP or the Emergency Department if new signs and symptoms develop, or if the current signs or symptoms continue to change or worsen  for further workup, evaluation and treatment as clinically indicated and appropriate  The patient will follow up with their current PCP if and as advised. If the patient does not currently have a PCP we will assist them in obtaining one.   The patient may need specialty follow up if the symptoms continue, in spite of conservative treatment and management, for further workup, evaluation, consultation and treatment as clinically indicated and appropriate.  Patient/parent/caregiver verbalized understanding and agreement of plan as discussed.  All questions were addressed during visit.  Please see discharge instructions below for further details of plan.  This office note has been dictated using Teaching laboratory technician.  Unfortunately, this method of dictation can sometimes lead to typographical or grammatical errors.  I apologize for your inconvenience in advance if this occurs.  Please do not hesitate to reach out to me if clarification is needed.      Eloise Hake [place], [place] 02/04/24 [phone]

## 2024-02-12 DIAGNOSIS — M79669 Pain in unspecified lower leg: Secondary | ICD-10-CM | POA: Diagnosis not present

## 2024-02-12 DIAGNOSIS — M25579 Pain in unspecified ankle and joints of unspecified foot: Secondary | ICD-10-CM | POA: Diagnosis not present

## 2024-02-25 DIAGNOSIS — M79669 Pain in unspecified lower leg: Secondary | ICD-10-CM | POA: Diagnosis not present

## 2024-02-25 DIAGNOSIS — M25579 Pain in unspecified ankle and joints of unspecified foot: Secondary | ICD-10-CM | POA: Diagnosis not present

## 2024-03-06 ENCOUNTER — Other Ambulatory Visit (HOSPITAL_BASED_OUTPATIENT_CLINIC_OR_DEPARTMENT_OTHER): Payer: Self-pay

## 2024-03-06 DIAGNOSIS — F419 Anxiety disorder, unspecified: Secondary | ICD-10-CM | POA: Diagnosis not present

## 2024-03-06 DIAGNOSIS — E559 Vitamin D deficiency, unspecified: Secondary | ICD-10-CM | POA: Diagnosis not present

## 2024-03-06 DIAGNOSIS — Z87440 Personal history of urinary (tract) infections: Secondary | ICD-10-CM | POA: Diagnosis not present

## 2024-03-06 DIAGNOSIS — Z0000 Encounter for general adult medical examination without abnormal findings: Secondary | ICD-10-CM | POA: Diagnosis not present

## 2024-03-06 DIAGNOSIS — E538 Deficiency of other specified B group vitamins: Secondary | ICD-10-CM | POA: Diagnosis not present

## 2024-03-06 MED ORDER — FLUOXETINE HCL 10 MG PO CAPS
10.0000 mg | ORAL_CAPSULE | Freq: Every day | ORAL | 3 refills | Status: DC
Start: 2024-03-06 — End: 2024-06-04
  Filled 2024-03-06: qty 90, 90d supply, fill #0

## 2024-03-18 ENCOUNTER — Other Ambulatory Visit (HOSPITAL_BASED_OUTPATIENT_CLINIC_OR_DEPARTMENT_OTHER): Payer: Self-pay

## 2024-03-19 ENCOUNTER — Other Ambulatory Visit (HOSPITAL_BASED_OUTPATIENT_CLINIC_OR_DEPARTMENT_OTHER): Payer: Self-pay

## 2024-03-19 MED ORDER — ESTRADIOL 0.1 MG/24HR TD PTTW
1.0000 | MEDICATED_PATCH | TRANSDERMAL | 0 refills | Status: AC
  Filled 2024-03-19: qty 24, 84d supply, fill #0

## 2024-03-24 DIAGNOSIS — N760 Acute vaginitis: Secondary | ICD-10-CM | POA: Diagnosis not present

## 2024-04-09 DIAGNOSIS — Z124 Encounter for screening for malignant neoplasm of cervix: Secondary | ICD-10-CM | POA: Diagnosis not present

## 2024-04-09 DIAGNOSIS — Z01419 Encounter for gynecological examination (general) (routine) without abnormal findings: Secondary | ICD-10-CM | POA: Diagnosis not present

## 2024-04-09 DIAGNOSIS — Z1231 Encounter for screening mammogram for malignant neoplasm of breast: Secondary | ICD-10-CM | POA: Diagnosis not present

## 2024-04-09 DIAGNOSIS — B3731 Acute candidiasis of vulva and vagina: Secondary | ICD-10-CM | POA: Diagnosis not present

## 2024-04-09 DIAGNOSIS — Z1389 Encounter for screening for other disorder: Secondary | ICD-10-CM | POA: Diagnosis not present
